# Patient Record
Sex: Female | Born: 1950 | Race: Black or African American | Hispanic: No | Smoking: Current every day smoker
Health system: Southern US, Community
[De-identification: ages and names within clinical notes are randomized; demographics above are authoritative.]

## PROBLEM LIST (undated history)

## (undated) DIAGNOSIS — R519 Headache, unspecified: Secondary | ICD-10-CM

## (undated) DIAGNOSIS — I10 Essential (primary) hypertension: Secondary | ICD-10-CM

## (undated) DIAGNOSIS — N200 Calculus of kidney: Secondary | ICD-10-CM

## (undated) DIAGNOSIS — R51 Headache: Secondary | ICD-10-CM

## (undated) HISTORY — PX: BREAST LUMPECTOMY: SHX2

## (undated) HISTORY — PX: ABDOMINAL HYSTERECTOMY: SHX81

## (undated) HISTORY — PX: APPENDECTOMY: SHX54

---

## 2014-10-25 ENCOUNTER — Other Ambulatory Visit: Payer: Self-pay

## 2014-10-25 ENCOUNTER — Encounter (HOSPITAL_BASED_OUTPATIENT_CLINIC_OR_DEPARTMENT_OTHER): Payer: Self-pay | Admitting: *Deleted

## 2014-10-25 ENCOUNTER — Emergency Department (HOSPITAL_BASED_OUTPATIENT_CLINIC_OR_DEPARTMENT_OTHER)
Admission: EM | Admit: 2014-10-25 | Discharge: 2014-10-26 | Disposition: A | Discharge status: Home / Self Care | Payer: Medicare HMO | Attending: Emergency Medicine | Admitting: Emergency Medicine

## 2014-10-25 DIAGNOSIS — M791 Myalgia: Secondary | ICD-10-CM | POA: Diagnosis not present

## 2014-10-25 DIAGNOSIS — R112 Nausea with vomiting, unspecified: Secondary | ICD-10-CM

## 2014-10-25 DIAGNOSIS — R197 Diarrhea, unspecified: Secondary | ICD-10-CM | POA: Insufficient documentation

## 2014-10-25 DIAGNOSIS — I10 Essential (primary) hypertension: Secondary | ICD-10-CM | POA: Insufficient documentation

## 2014-10-25 DIAGNOSIS — Z720 Tobacco use: Secondary | ICD-10-CM | POA: Insufficient documentation

## 2014-10-25 DIAGNOSIS — A084 Viral intestinal infection, unspecified: Secondary | ICD-10-CM | POA: Diagnosis not present

## 2014-10-25 HISTORY — DX: Essential (primary) hypertension: I10

## 2014-10-25 NOTE — ED Notes (Signed)
Pt reports N/V/D since 1430 today after eating leftover [employer] shrimp meal. Food purchased 1 day ago

## 2014-10-25 NOTE — ED Notes (Signed)
I placed patient on monitor. 

## 2014-10-26 ENCOUNTER — Inpatient Hospital Stay (HOSPITAL_COMMUNITY)
Admission: AD | Admit: 2014-10-26 | Discharge: 2014-10-26 | Disposition: A | Discharge status: Home / Self Care | Payer: Medicare HMO | Source: Ambulatory Visit | Attending: Obstetrics & Gynecology | Admitting: Obstetrics & Gynecology

## 2014-10-26 ENCOUNTER — Encounter (HOSPITAL_COMMUNITY): Payer: Self-pay | Admitting: *Deleted

## 2014-10-26 ENCOUNTER — Encounter (HOSPITAL_BASED_OUTPATIENT_CLINIC_OR_DEPARTMENT_OTHER): Payer: Self-pay | Admitting: Emergency Medicine

## 2014-10-26 DIAGNOSIS — R112 Nausea with vomiting, unspecified: Secondary | ICD-10-CM | POA: Diagnosis present

## 2014-10-26 DIAGNOSIS — R197 Diarrhea, unspecified: Secondary | ICD-10-CM | POA: Insufficient documentation

## 2014-10-26 DIAGNOSIS — A084 Viral intestinal infection, unspecified: Secondary | ICD-10-CM

## 2014-10-26 HISTORY — DX: Headache, unspecified: R51.9

## 2014-10-26 HISTORY — DX: Headache: R51

## 2014-10-26 LAB — BASIC METABOLIC PANEL
Anion gap: 11 (ref 5–15)
BUN: 19 mg/dL (ref 6–23)
CALCIUM: 9.3 mg/dL (ref 8.4–10.5)
CO2: 21 mmol/L (ref 19–32)
Chloride: 107 mmol/L (ref 96–112)
Creatinine, Ser: 0.91 mg/dL (ref 0.50–1.10)
GFR calc Af Amer: 75 mL/min — ABNORMAL LOW (ref >90)
GFR calc non Af Amer: 65 mL/min — ABNORMAL LOW (ref >90)
Glucose, Bld: 132 mg/dL — ABNORMAL HIGH (ref 70–99)
Potassium: 3.7 mmol/L (ref 3.5–5.1)
Sodium: 139 mmol/L (ref 135–145)

## 2014-10-26 LAB — URINALYSIS, ROUTINE W REFLEX MICROSCOPIC
Bilirubin Urine: NEGATIVE
Glucose, UA: NEGATIVE mg/dL
KETONES UR: 15 mg/dL — AB
LEUKOCYTES UA: NEGATIVE
NITRITE: NEGATIVE
PH: 6 (ref 5.0–8.0)
PROTEIN: NEGATIVE mg/dL
Specific Gravity, Urine: >1.03 — ABNORMAL HIGH (ref 1.005–1.030)
Urobilinogen, UA: 0.2 mg/dL (ref 0.0–1.0)

## 2014-10-26 LAB — URINE MICROSCOPIC-ADD ON

## 2014-10-26 MED ORDER — PROMETHAZINE HCL 25 MG PO TABS: 12.5000 mg | ORAL_TABLET | Freq: Four times a day (QID) | ORAL | Status: DC | PRN

## 2014-10-26 MED ORDER — ACETAMINOPHEN 500 MG PO TABS
1000.0000 mg | ORAL_TABLET | Freq: Once | ORAL | Status: AC
  Administered 2014-10-26: 1000 mg via ORAL

## 2014-10-26 MED ORDER — KETOROLAC TROMETHAMINE 30 MG/ML IJ SOLN
INTRAMUSCULAR | Status: AC
  Administered 2014-10-26: 30 mg via INTRAVENOUS
  Filled 2014-10-26: qty 1

## 2014-10-26 MED ORDER — ONDANSETRON HCL 4 MG/2ML IJ SOLN
4.0000 mg | Freq: Once | INTRAMUSCULAR | Status: AC
  Administered 2014-10-26: 4 mg via INTRAVENOUS
  Filled 2014-10-26: qty 2

## 2014-10-26 MED ORDER — ACETAMINOPHEN 500 MG PO TABS
ORAL_TABLET | ORAL | Status: AC
  Administered 2014-10-26: 1000 mg via ORAL
  Filled 2014-10-26: qty 2

## 2014-10-26 MED ORDER — SODIUM CHLORIDE 0.9 % IV BOLUS (SEPSIS)
1000.0000 mL | Freq: Once | INTRAVENOUS | Status: AC
  Administered 2014-10-26: 1000 mL via INTRAVENOUS

## 2014-10-26 MED ORDER — ONDANSETRON 8 MG PO TBDP: ORAL_TABLET | ORAL | Status: DC

## 2014-10-26 MED ORDER — KETOROLAC TROMETHAMINE 30 MG/ML IJ SOLN
30.0000 mg | Freq: Once | INTRAMUSCULAR | Status: AC
  Administered 2014-10-26: 30 mg via INTRAVENOUS

## 2014-10-26 MED ORDER — SODIUM CHLORIDE 0.9 % IV SOLN
Freq: Once | INTRAVENOUS | Status: AC
  Administered 2014-10-26: 500 mL via INTRAVENOUS

## 2014-10-26 NOTE — ED Provider Notes (Signed)
CSN: [id number]     Arrival date & time 10/25/14  2315 History  This chart was scribed for Mattias Walmsley, MD by Ronney LionSuzanne Le, ED Scribe. This patient was seen in room MH08/MH08 and the patient's care was started at 12:09 AM.    Chief Complaint  Patient presents with  . Emesis  . Diarrhea   Patient is a 64 y.o. female presenting with vomiting and diarrhea. The history is provided by the patient. No language interpreter was used.  Emesis Severity:  Moderate Duration:  10 hours Timing:  Constant Progression:  Unchanged Chronicity:  New Context: not post-tussive and not self-induced   Relieved by:  Nothing Worsened by:  Nothing tried Ineffective treatments:  None tried Associated symptoms: diarrhea and myalgias   Diarrhea Associated symptoms: myalgias and vomiting      HPI Comments: Ronnell GuadalajaraLinda Cuda is a 64 y.o. female who presents to the Emergency Department complaining of constant nausea and vomiting that began about 10 hours ago, at 2 PM today. Patient's daughter is also sick with the same symptoms, and she also has children in the household. Patient has a history of hypertension; but denies any other medical problems.   Past Medical History  Diagnosis Date  . Hypertension    Past Surgical History  Procedure Laterality Date  . Appendectomy    . Abdominal hysterectomy     History reviewed. No pertinent family history. History  Substance Use Topics  . Smoking status: Current Every Day Smoker  . Smokeless tobacco: Never Used  . Alcohol Use: Yes   OB History    No data available     Review of Systems  Gastrointestinal: Positive for nausea, vomiting and diarrhea.  Genitourinary: Negative for dysuria.  Musculoskeletal: Positive for myalgias.  All other systems reviewed and are negative.   Allergies  Review of patient's allergies indicates no known allergies.  Home Medications   Prior to Admission medications   Not on File   BP 142/99 mmHg  Pulse 116  Temp(Src) 100  F (37.8 C) (Oral)  Resp 16  Ht 5' (1.524 m)  Wt 99 lb (44.906 kg)  BMI 19.33 kg/m2  SpO2 94%  LMP 10/25/2014 Physical Exam  Constitutional: She is oriented to person, place, and time. She appears well-developed and well-nourished. No distress.  HENT:  Head: Normocephalic and atraumatic.  Mouth/Throat: Oropharynx is clear and moist. No oropharyngeal exudate.  Eyes: EOM are normal. Pupils are equal, round, and reactive to light.  Neck: Normal range of motion. Neck supple.  Cardiovascular: Normal rate, regular rhythm and normal heart sounds.   Pulmonary/Chest: Effort normal and breath sounds normal. No respiratory distress. She has no wheezes. She has no rales.  Lungs are clear to auscultation.   Abdominal: Soft. Bowel sounds are normal. She exhibits no distension and no mass. There is no tenderness. There is no rebound and no guarding.  Musculoskeletal: Normal range of motion.  Neurological: She is alert and oriented to person, place, and time. She has normal reflexes. No cranial nerve deficit.  Skin: Skin is warm and dry.  Psychiatric: She has a normal mood and affect.  Nursing note and vitals reviewed.   ED Course  Procedures (including critical care time)  DIAGNOSTIC STUDIES: Oxygen Saturation is 94% on room air, adequate by my interpretation.    COORDINATION OF CARE: 12:12 AM - Discussed treatment plan with pt at bedside which includes medication, and pt agreed to plan.   Labs Review Labs Reviewed  BASIC METABOLIC PANEL -  Abnormal; Notable for the following:    Glucose, Bld 132 (*)    GFR calc non Af Amer 65 (*)    GFR calc Af Amer 75 (*)    All other components within normal limits  URINALYSIS, ROUTINE W REFLEX MICROSCOPIC    Imaging Review No results found.   EKG Interpretation None      MDM   Final diagnoses:  None   Both patient and her daughter are here with same symptoms and time course.  Exam is benign and reassuring and patient is feeling better  post medication without further episodes in the department   I personally performed the services described in this documentation, which was scribed in my presence. The recorded information has been reviewed and is accurate.     Cy Blamer, MD 10/26/14 1314

## 2014-10-26 NOTE — Discharge Instructions (Signed)

## 2014-10-26 NOTE — MAU Provider Note (Signed)
  History    64 yo female in with nausea, vomiting and diarrhea for 4 days, daughter is also sick with the same. Was seen in [place] last night was given zofran 8 mg ODT but has not taken it yet. Dose given on eval... CSN: [id number]  Arrival date and time: 10/26/14 2143   None     No chief complaint on file.  HPI  OB History    No data available      Past Medical History  Diagnosis Date  . Hypertension     Past Surgical History  Procedure Laterality Date  . Appendectomy    . Abdominal hysterectomy      No family history on file.  History  Substance Use Topics  . Smoking status: Current Every Day Smoker  . Smokeless tobacco: Never Used  . Alcohol Use: Yes    Allergies: No Known Allergies  Prescriptions prior to admission  Medication Sig Dispense Refill Last Dose  . ondansetron (ZOFRAN ODT) 8 MG disintegrating tablet 8mg  ODT q8 hours prn nausea, not for pregnant patients 4 tablet 0     Review of Systems  Constitutional: Negative.   HENT: Negative.   Eyes: Negative.   Respiratory: Negative.   Cardiovascular: Negative.   Gastrointestinal: Positive for nausea, vomiting and diarrhea.  Genitourinary: Negative.   Musculoskeletal: Negative.   Skin: Negative.   Neurological: Negative.   Endo/Heme/Allergies: Negative.   Psychiatric/Behavioral: Negative.    Physical Exam   Last menstrual period 10/25/2014.  Physical Exam  Constitutional: She is oriented to person, place, and time. She appears well-developed and well-nourished.  HENT:  Head: Normocephalic.  Eyes: Pupils are equal, round, and reactive to light.  Neck: Normal range of motion.  Cardiovascular: Normal rate, regular rhythm, normal heart sounds and intact distal pulses.   Respiratory: Effort normal and breath sounds normal.  GI: Soft. Bowel sounds are normal.  Musculoskeletal: Normal range of motion.  Neurological: She is alert and oriented to person, place, and time. She has normal reflexes.   Skin: Skin is warm and dry.  Psychiatric: She has a normal mood and affect. Her behavior is normal. Judgment and thought content normal.    MAU Course  Procedures  MDM gasteroenteritis  Assessment and Plan  Gastroenteritis, po zofran taken by pt on admit to unit. Will introduce liqiuds and d/c home if retains.  Wyvonnia DuskyLAWSON, Gazella Anglin DARLENE 10/26/2014, 9:59 PM

## 2014-10-26 NOTE — ED Notes (Signed)
Mrs Pollie MeyerMcIntyre remains tachycardiac and c/o generalized body aches after 2 liters 0.9 NSS IV infused. Order for 500 ml 0.9 NSS obtained and infusion nearly completed. Medicated for fever and and pain.

## 2014-10-26 NOTE — MAU Note (Signed)
Pt was seen last night at [facility] for Nausea vomiting and diarrhea. She just took her first dose of zofran ODT at 2200 tonight.

## 2014-12-16 ENCOUNTER — Emergency Department (HOSPITAL_BASED_OUTPATIENT_CLINIC_OR_DEPARTMENT_OTHER)
Admission: EM | Admit: 2014-12-16 | Discharge: 2014-12-16 | Disposition: A | Discharge status: Home / Self Care | Payer: Medicare HMO | Attending: Emergency Medicine | Admitting: Emergency Medicine

## 2014-12-16 ENCOUNTER — Encounter (HOSPITAL_BASED_OUTPATIENT_CLINIC_OR_DEPARTMENT_OTHER): Payer: Self-pay | Admitting: *Deleted

## 2014-12-16 DIAGNOSIS — M7989 Other specified soft tissue disorders: Secondary | ICD-10-CM | POA: Diagnosis not present

## 2014-12-16 DIAGNOSIS — I10 Essential (primary) hypertension: Secondary | ICD-10-CM | POA: Diagnosis not present

## 2014-12-16 DIAGNOSIS — Z792 Long term (current) use of antibiotics: Secondary | ICD-10-CM | POA: Insufficient documentation

## 2014-12-16 DIAGNOSIS — Z720 Tobacco use: Secondary | ICD-10-CM | POA: Insufficient documentation

## 2014-12-16 DIAGNOSIS — Z79899 Other long term (current) drug therapy: Secondary | ICD-10-CM | POA: Diagnosis not present

## 2014-12-16 DIAGNOSIS — R1084 Generalized abdominal pain: Secondary | ICD-10-CM | POA: Insufficient documentation

## 2014-12-16 DIAGNOSIS — R42 Dizziness and giddiness: Secondary | ICD-10-CM | POA: Diagnosis not present

## 2014-12-16 DIAGNOSIS — M5432 Sciatica, left side: Secondary | ICD-10-CM

## 2014-12-16 DIAGNOSIS — M545 Low back pain: Secondary | ICD-10-CM | POA: Diagnosis present

## 2014-12-16 LAB — URINE MICROSCOPIC-ADD ON

## 2014-12-16 LAB — URINALYSIS, ROUTINE W REFLEX MICROSCOPIC
BILIRUBIN URINE: NEGATIVE
GLUCOSE, UA: NEGATIVE mg/dL
KETONES UR: NEGATIVE mg/dL
Leukocytes, UA: NEGATIVE
Nitrite: NEGATIVE
PROTEIN: NEGATIVE mg/dL
Specific Gravity, Urine: 1.027 (ref 1.005–1.030)
UROBILINOGEN UA: 0.2 mg/dL (ref 0.0–1.0)
pH: 6.5 (ref 5.0–8.0)

## 2014-12-16 MED ORDER — CYCLOBENZAPRINE HCL 5 MG PO TABS: 5.0000 mg | ORAL_TABLET | Freq: Three times a day (TID) | ORAL | Status: DC | PRN

## 2014-12-16 MED ORDER — OXYCODONE-ACETAMINOPHEN 5-325 MG PO TABS: 1.0000 | ORAL_TABLET | ORAL | Status: DC | PRN

## 2014-12-16 NOTE — ED Notes (Addendum)
Pt c/o back pain which radiates around to abd x 4 days Pty seen 2 weeks ago by PMD DX bronchitis pt states increased coughing

## 2014-12-16 NOTE — Discharge Instructions (Signed)
Return to the ED with any concerns including leg weakness, not able to urinate, loss of control of bowel or bladder, fever/chills, decreased level of alertness/lethargy, or any other alarming symptoms

## 2014-12-16 NOTE — ED Provider Notes (Signed)
CSN: [id number]     Arrival date & time 12/16/14  1742 History  This chart was scribed for Mary Scott, MD by Ronney Lion, ED Scribe. This patient was seen in room MH03/MH03 and the patient's care was started at 6:56 PM.    Chief Complaint  Patient presents with  . Back Pain   The history is provided by the patient. No language interpreter was used.    HPI Comments: Mary Bradshaw is a 64 y.o. female with a PMHx of HTN, who presents to the Emergency Department complaining of lower back pain that began 3 days ago. She also complains of associated dizziness, generalized abdominal pain, and cough. She mentions that her legs occasionally cramp and swell, which cause difficulty walking, but she is still able to ambulate. She denies any known fall or injury. She also denies having any recent recurrent problems with back pain. Patient tried Tylenol for her back pain with minimal relief. She states she also had a yeast infection last week, for which she took cefdinir that she finished about 2 days ago, as well as Tessalon for her cough. She denies any known fever, weakness, dysuria, or difficulty urinating.    Past Medical History  Diagnosis Date  . Hypertension   . Headache    Past Surgical History  Procedure Laterality Date  . Appendectomy    . Abdominal hysterectomy    . Breast lumpectomy      left side   History reviewed. No pertinent family history. History  Substance Use Topics  . Smoking status: Current Every Day Smoker -- 1.00 packs/day    Types: Cigarettes  . Smokeless tobacco: Never Used  . Alcohol Use: No   OB History    Gravida Para Term Preterm AB TAB SAB Ectopic Multiple Living   Review of Systems  Constitutional: Negative for fever.  Cardiovascular: Positive for leg swelling.  Gastrointestinal: Positive for abdominal pain (generalized).  Genitourinary: Negative for dysuria and difficulty urinating.  Musculoskeletal: Positive for back pain.   Neurological: Positive for dizziness. Negative for weakness.  All other systems reviewed and are negative.   Allergies  Review of patient's allergies indicates no known allergies.  Home Medications   Prior to Admission medications   Medication Sig Start Date End Date Taking? Authorizing Provider  albuterol (PROVENTIL) (2.5 MG/3ML) 0.083% nebulizer solution Take 2.5 mg by nebulization every 6 (six) hours as needed for wheezing or shortness of breath.   Yes Historical Provider, MD  benzonatate (TESSALON) 100 MG capsule Take by mouth 3 (three) times daily as needed for cough.   Yes Historical Provider, MD  cefdinir (OMNICEF) 300 MG capsule Take 300 mg by mouth 2 (two) times daily.   Yes Historical Provider, MD  lisinopril-hydrochlorothiazide (PRINZIDE,ZESTORETIC) 20-12.5 MG per tablet Take 1 tablet by mouth daily.   Yes Historical Provider, MD  predniSONE (DELTASONE) 2.5 MG tablet Take 2.5 mg by mouth daily with breakfast.   Yes Historical Provider, MD  cyclobenzaprine (FLEXERIL) 5 MG tablet Take 1 tablet (5 mg total) by mouth 3 (three) times daily as needed for muscle spasms. 12/16/14   Mary Scott, MD  ondansetron (ZOFRAN ODT) 8 MG disintegrating tablet  ODT q8 hours prn nausea, not for pregnant patients Patient taking differently: Take 8 mg by mouth every 8 (eight) hours as needed for nausea or vomiting.  ODT q8 hours prn nausea, not for pregnant patients 10/26/14   April  Palumbo, MD  oxyCODONE-acetaminophen (ROXICET) 5-325 MG per tablet Take 1-2 tablets by mouth every 4 (four) hours as needed for severe pain. 12/16/14   Mary Scott, MD  promethazine (PHENERGAN) 25 MG tablet Take 0.5-1 tablets (12.5-25 mg total) by mouth every 6 (six) hours as needed. 10/26/14   Armando Reichert, CNM   BP 97/70 mmHg  Pulse 78  Temp(Src) 99.1 F (37.3 C)  Resp 18  Ht 5' (1.524 m)  Wt 100 lb (45.36 kg)  BMI 19.53 kg/m2  SpO2 100%  LMP 10/25/1995  Vitals reviewed Physical Exam  Physical  Examination: General appearance - alert, well appearing, and in no distress Mental status - alert, oriented to person, place, and time Eyes -no conjunctival injection, no scleral icterus Mouth - mucous membranes moist, pharynx normal without lesions Chest - clear to auscultation, no wheezes, rales or rhonchi, symmetric air entry Heart - normal rate, regular rhythm, normal S1, S2, no murmurs, rubs, clicks or gallops Abdomen - soft, nontender, nondistended, no masses or organomegaly, nabs Back- left paraspinal tenderness, no CVA tenderness, no midline tenderness Extremities - peripheral pulses normal, no pedal edema, no clubbing or cyanosis Skin - normal coloration and turgor, no rashes  ED Course  Procedures (including critical care time)  DIAGNOSTIC STUDIES: Oxygen Saturation is 100% on RA, normal by my interpretation.    COORDINATION OF CARE: 7:00 PM - Discussed treatment plan with pt at bedside which includes short-term pain medication, muscle relaxants, and f/u with PCP, and pt agreed to plan. Will also do a UA.   Labs Review Results for orders placed or performed during the hospital encounter of 12/16/14  Urinalysis, Routine w reflex microscopic (not at [facility])  Result Value Ref Range   Color, Urine YELLOW YELLOW   APPearance CLEAR CLEAR   Specific Gravity, Urine 1.027 1.005 - 1.030   pH 6.5 5.0 - 8.0   Glucose, UA NEGATIVE NEGATIVE mg/dL   Hgb urine dipstick SMALL (A) NEGATIVE   Bilirubin Urine NEGATIVE NEGATIVE   Ketones, ur NEGATIVE NEGATIVE mg/dL   Protein, ur NEGATIVE NEGATIVE mg/dL   Urobilinogen, UA 0.2 0.0 - 1.0 mg/dL   Nitrite NEGATIVE NEGATIVE   Leukocytes, UA NEGATIVE NEGATIVE  Urine microscopic-add on  Result Value Ref Range   Squamous Epithelial / LPF RARE RARE   WBC, UA 0-2 <3 WBC/hpf   RBC / HPF 3-6 <3 RBC/hpf   Bacteria, UA RARE RARE   Urine-Other MUCOUS PRESENT     MDM   Final diagnoses:  Sciatica without back pain, left   Pt presenting with c/o  left lower back pain, no midline tenderness. No signs or symptoms of cauda equina.  No fever to suggest epidural abscess.  No abdominal tenderness.  Pt also c/o cough and cold symptoms.  She finished course of cefdinir 2 days ago.  Normal respiratory effort, lungs are CTA.  No fever.  Discharged with strict return precautions.  Pt agreeable with plan.  I personally performed the services described in this documentation, which was scribed in my presence. The recorded information has been reviewed and is accurate.     Mary Scott, MD 12/16/14 2051

## 2016-10-25 ENCOUNTER — Emergency Department (HOSPITAL_BASED_OUTPATIENT_CLINIC_OR_DEPARTMENT_OTHER)
Admission: EM | Admit: 2016-10-25 | Discharge: 2016-10-25 | Disposition: A | Discharge status: Home / Self Care | Payer: Medicare HMO | Attending: Emergency Medicine | Admitting: Emergency Medicine

## 2016-10-25 ENCOUNTER — Encounter (HOSPITAL_BASED_OUTPATIENT_CLINIC_OR_DEPARTMENT_OTHER): Payer: Self-pay | Admitting: Emergency Medicine

## 2016-10-25 ENCOUNTER — Emergency Department (HOSPITAL_BASED_OUTPATIENT_CLINIC_OR_DEPARTMENT_OTHER): Payer: Medicare HMO

## 2016-10-25 DIAGNOSIS — Z79899 Other long term (current) drug therapy: Secondary | ICD-10-CM | POA: Insufficient documentation

## 2016-10-25 DIAGNOSIS — F17210 Nicotine dependence, cigarettes, uncomplicated: Secondary | ICD-10-CM | POA: Diagnosis not present

## 2016-10-25 DIAGNOSIS — R059 Cough, unspecified: Secondary | ICD-10-CM

## 2016-10-25 DIAGNOSIS — R05 Cough: Secondary | ICD-10-CM | POA: Diagnosis not present

## 2016-10-25 DIAGNOSIS — I10 Essential (primary) hypertension: Secondary | ICD-10-CM | POA: Diagnosis not present

## 2016-10-25 DIAGNOSIS — R072 Precordial pain: Secondary | ICD-10-CM | POA: Insufficient documentation

## 2016-10-25 DIAGNOSIS — R0981 Nasal congestion: Secondary | ICD-10-CM | POA: Insufficient documentation

## 2016-10-25 LAB — BASIC METABOLIC PANEL
ANION GAP: 9 (ref 5–15)
BUN: 14 mg/dL (ref 6–20)
CALCIUM: 9.3 mg/dL (ref 8.9–10.3)
CO2: 25 mmol/L (ref 22–32)
Chloride: 104 mmol/L (ref 101–111)
Creatinine, Ser: 0.83 mg/dL (ref 0.44–1.00)
GFR calc Af Amer: >60 mL/min (ref >60)
Glucose, Bld: 93 mg/dL (ref 65–99)
POTASSIUM: 4 mmol/L (ref 3.5–5.1)
Sodium: 138 mmol/L (ref 135–145)

## 2016-10-25 LAB — TROPONIN I: Troponin I: <0.03 ng/mL (ref <0.03)

## 2016-10-25 LAB — CBC
HCT: 45.5 % (ref 36.0–46.0)
Hemoglobin: 15.3 g/dL — ABNORMAL HIGH (ref 12.0–15.0)
MCH: 32.3 pg (ref 26.0–34.0)
MCHC: 33.6 g/dL (ref 30.0–36.0)
MCV: 96 fL (ref 78.0–100.0)
Platelets: 257 10*3/uL (ref 150–400)
RBC: 4.74 MIL/uL (ref 3.87–5.11)
RDW: 13.8 % (ref 11.5–15.5)
WBC: 4.5 10*3/uL (ref 4.0–10.5)

## 2016-10-25 MED ORDER — ASPIRIN 81 MG PO CHEW
324.0000 mg | CHEWABLE_TABLET | Freq: Once | ORAL | Status: AC
  Administered 2016-10-25: 324 mg via ORAL
  Filled 2016-10-25: qty 4

## 2016-10-25 NOTE — ED Provider Notes (Signed)
Emergency Department Provider Note   I have reviewed the triage vital signs and the nursing notes.   HISTORY  Chief Complaint Chest Pain   HPI Mary Bradshaw is a 66 y.o. female with past medical history of headache and hypertension presents to the emergency department for evaluation of intermittent chest pain, cough, nasal congestion for the last several weeks with nausea this AM. Patient states she is intermittently had left sided, nonradiating, chest pain for the past several weeks. Today she has pain most of the day. This morning she had pain for approximately 15 minutes that resolved without any treatment. No exacerbating or alleviating factors. Not worse with coughing. She denies any fevers or chills. No prior history of ACS. Not actively having pain.    Past Medical History:  Diagnosis Date  . Headache   . Hypertension     There are no active problems to display for this patient.   Past Surgical History:  Procedure Laterality Date  . ABDOMINAL HYSTERECTOMY    . APPENDECTOMY    . BREAST LUMPECTOMY     left side    Current Outpatient Rx  . Order #: [id number] Class: Historical Med  . Order #: [id number] Class: Historical Med  . Order #: [id number] Class: Historical Med  . Order #: [id number] Class: Print  . Order #: [id number] Class: Historical Med  . Order #: [id number] Class: Print  . Order #: [id number] Class: Print  . Order #: [id number] Class: Historical Med  . Order #: [id number] Class: Normal    Allergies Patient has no known allergies.  History reviewed. No pertinent family history.  Social History Social History  Substance Use Topics  . Smoking status: Current Every Day Smoker    Packs/day: 1.00    Types: Cigarettes  . Smokeless tobacco: Never Used  . Alcohol use No    Review of Systems  Constitutional: No fever/chills Eyes: No visual changes. ENT: No sore throat. Cardiovascular: Positive intermittent chest pain. Respiratory: Denies shortness of  breath. Positive cough and runny nose.  Gastrointestinal: No abdominal pain.  No nausea, no vomiting.  No diarrhea.  No constipation. Genitourinary: Negative for dysuria. Musculoskeletal: Negative for back pain. Skin: Negative for rash. Neurological: Negative for headaches, focal weakness or numbness.  10-point ROS otherwise negative.  ____________________________________________   PHYSICAL EXAM:  VITAL SIGNS: ED Triage Vitals  Enc Vitals Group     BP 10/25/16 1023 (!) 152/101     Pulse Rate 10/25/16 1023 82     Resp 10/25/16 1023 (!) 24     Temp 10/25/16 1023 98.4 F (36.9 C)     Temp Source 10/25/16 1023 Oral     SpO2 10/25/16 1023 100 %     Weight 10/25/16 1024 100 lb (45.4 kg)     Height 10/25/16 1024 5' (1.524 m)     Pain Score 10/25/16 1023 6   Constitutional: Alert and oriented. Well appearing and in no acute distress. Eyes: Conjunctivae are normal.  Head: Atraumatic. Nose: No congestion/rhinnorhea. Mouth/Throat: Mucous membranes are moist.  Oropharynx non-erythematous. Neck: No stridor.   Cardiovascular: Normal rate, regular rhythm. Good peripheral circulation. Grossly normal heart sounds.   Respiratory: Normal respiratory effort.  No retractions. Lungs CTAB. Gastrointestinal: Soft and nontender. No distention.  Musculoskeletal: No lower extremity tenderness nor edema. No gross deformities of extremities. No chest wall tenderness.  Neurologic:  Normal speech and language. No gross focal neurologic deficits are appreciated.  Skin:  Skin is warm, dry and intact. No rash noted.   ____________________________________________   Vickie Epley (  all labs ordered are listed, but only abnormal results are displayed)  Labs Reviewed  CBC - Abnormal; Notable for the following:       Result Value   Hemoglobin 15.3 (*)    All other components within normal limits  BASIC METABOLIC PANEL  TROPONIN I  TROPONIN I   ____________________________________________  EKG   EKG  Interpretation  Date/Time:  Wednesday October 25 2016 10:23:46 EDT Ventricular Rate:  84 PR Interval:    QRS Duration: 86 QT Interval:  369 QTC Calculation: 437 R Axis:   -53 Text Interpretation:  Sinus rhythm Left anterior fascicular block Abnormal R-wave progression, early transition Left ventricular hypertrophy Baseline wander in lead(s) V1 V6 No STEMI.  Confirmed by LONG MD, JOSHUA [id number]) on 10/25/2016 11:05:17 AM       ____________________________________________  RADIOLOGY  Dg Chest 2 View  Result Date: 10/25/2016 CLINICAL DATA:  66 year old female with a history of cough and congestion for 2 weeks EXAM: CHEST  2 VIEW COMPARISON:  None. FINDINGS: Cardiomediastinal silhouette within normal limits. No evidence of central vascular congestion. Calcifications of the aortic arch. Coarsened interstitial markings with pleuroparenchymal scarring at the apices of the lungs. No confluent airspace disease.  No pneumothorax or pleural effusion. No displaced fracture.  Apex right curvature of the thoracic spine. Calcifications of abdominal aorta. IMPRESSION: No evidence of acute cardiopulmonary disease, with likely chronic changes. Aortic atherosclerosis. Electronically Signed   By: Gilmer Mor D.O.   On: 10/25/2016 10:47    ____________________________________________   PROCEDURES  Procedure(s) performed:   Procedures  None ____________________________________________   INITIAL IMPRESSION / ASSESSMENT AND PLAN / ED COURSE  Pertinent labs & imaging results that were available during my care of the patient were reviewed by me and considered in my medical decision making (see chart for details).  Patient resents to the emergency department for evaluation of intermittent chest pain for the last several weeks with associated cough, congestion, nausea that started this morning. No active vomiting. The patient not currently having chest pain. No focal tenderness to palpation of the chest  wall. No fevers or chills. Patient is well-appearing. She does have several risk factors for coronary artery disease but feels less likely in the setting of prolonged pain symptoms that are atypical. EKG is unremarkable. Plan for lab work and reassessment.  1:50 PM Second troponin is negative. No active chest pain. Suspect URI symptoms as cause but will refer to PCP and Cardiology for further risk stratification.   At this time, I do not feel there is any life-threatening condition present. I have reviewed and discussed all results (EKG, imaging, lab, urine as appropriate), exam findings with patient. I have reviewed nursing notes and appropriate previous records.  I feel the patient is safe to be discharged home without further emergent workup. Discussed usual and customary return precautions. Patient and family (if present) verbalize understanding and are comfortable with this plan.  Patient will follow-up with their primary care provider. If they do not have a primary care provider, information for follow-up has been provided to them. All questions have been answered.  ____________________________________________  FINAL CLINICAL IMPRESSION(S) / ED DIAGNOSES  Final diagnoses:  Precordial chest pain  Cough     MEDICATIONS GIVEN DURING THIS VISIT:  Medications  aspirin chewable tablet 324 mg (324 mg Oral Given 10/25/16 1140)     NEW OUTPATIENT MEDICATIONS STARTED DURING THIS VISIT:  None   Note:  This document was prepared using Conservation officer, historic buildings and  may include unintentional dictation errors.  Alona Bene, MD Emergency Medicine   Maia Plan, MD 10/25/16 [phone]

## 2016-10-25 NOTE — ED Triage Notes (Signed)
Patient reports that she has had dizziness x 3 weeks with nasal drainage. Patient reports that about 2 -3 days ago she started to have a cough and central chest pain. The patient reports that she can not get anything to "calm the pain" - has tried several home remedies

## 2016-10-25 NOTE — Discharge Instructions (Signed)

## 2017-07-13 ENCOUNTER — Emergency Department (HOSPITAL_BASED_OUTPATIENT_CLINIC_OR_DEPARTMENT_OTHER): Payer: Medicare HMO

## 2017-07-13 ENCOUNTER — Encounter (HOSPITAL_BASED_OUTPATIENT_CLINIC_OR_DEPARTMENT_OTHER): Payer: Self-pay | Admitting: Emergency Medicine

## 2017-07-13 ENCOUNTER — Emergency Department (HOSPITAL_BASED_OUTPATIENT_CLINIC_OR_DEPARTMENT_OTHER)
Admission: EM | Admit: 2017-07-13 | Discharge: 2017-07-13 | Disposition: A | Discharge status: Home / Self Care | Payer: Medicare HMO | Attending: Emergency Medicine | Admitting: Emergency Medicine

## 2017-07-13 ENCOUNTER — Other Ambulatory Visit: Payer: Self-pay

## 2017-07-13 DIAGNOSIS — K5900 Constipation, unspecified: Secondary | ICD-10-CM | POA: Diagnosis not present

## 2017-07-13 DIAGNOSIS — R05 Cough: Secondary | ICD-10-CM | POA: Diagnosis not present

## 2017-07-13 DIAGNOSIS — Z79899 Other long term (current) drug therapy: Secondary | ICD-10-CM | POA: Insufficient documentation

## 2017-07-13 DIAGNOSIS — R059 Cough, unspecified: Secondary | ICD-10-CM

## 2017-07-13 DIAGNOSIS — F17210 Nicotine dependence, cigarettes, uncomplicated: Secondary | ICD-10-CM | POA: Diagnosis not present

## 2017-07-13 DIAGNOSIS — R109 Unspecified abdominal pain: Secondary | ICD-10-CM

## 2017-07-13 DIAGNOSIS — I10 Essential (primary) hypertension: Secondary | ICD-10-CM | POA: Insufficient documentation

## 2017-07-13 MED ORDER — ALBUTEROL SULFATE HFA 108 (90 BASE) MCG/ACT IN AERS: 1.0000 | INHALATION_SPRAY | Freq: Four times a day (QID) | RESPIRATORY_TRACT | 0 refills | Status: AC | PRN

## 2017-07-13 MED ORDER — DOCUSATE SODIUM 100 MG PO CAPS: 100.0000 mg | ORAL_CAPSULE | Freq: Two times a day (BID) | ORAL | 0 refills | Status: DC

## 2017-07-13 MED ORDER — BENZONATATE 100 MG PO CAPS: 100.0000 mg | ORAL_CAPSULE | Freq: Three times a day (TID) | ORAL | 0 refills | Status: DC

## 2017-07-13 MED FILL — DOK 100 MG SOFTGEL: 100 | 50 days supply | Qty: 100 | Fill #0

## 2017-07-13 MED FILL — VENTOLIN HFA 90 MCG INHALER: 108 (90 BAS | 25 days supply | Qty: 18 | Fill #0

## 2017-07-13 MED FILL — BENZONATATE 100 MG CAPSULE: 100 | 7 days supply | Qty: 21 | Fill #0

## 2017-07-13 NOTE — Discharge Instructions (Signed)
Placed in constipation. Albuterol inhaler as needed. Take Tessalon for your cough. Primary care follow-up.

## 2017-07-13 NOTE — ED Triage Notes (Addendum)
Patient states that she has had a cough x 2 weeks. Denies any Fever  - patient states that she is bloated as well - and maybe has constipation

## 2017-07-13 NOTE — ED Provider Notes (Signed)
[facility] EMERGENCY DEPARTMENT Provider Note   CSN: [id number] Arrival date & time: 07/13/17  0915     History   Chief Complaint Chief Complaint  Patient presents with  . Cough    HPI Mary Bradshaw is a 67 y.o. female. Chief complaint is cough, constipation  HPI 67 year old female. Is had "a cold" for 3 weeks with occasional cough and runny nose. Some sinus congestion and no sinus pain. No fever. Temp 98 this morning. Feels congested and full in her chest. States she normally has only bowel movements once or twice per week but has noticed that she felt bloated and uncomfortable. She uses suppository yesterday and had bowel movement. Still feels "very full". No vomiting. No pain. No diarrhea.  Past Medical History:  Diagnosis Date  . Headache   . Hypertension     There are no active problems to display for this patient.   Past Surgical History:  Procedure Laterality Date  . ABDOMINAL HYSTERECTOMY    . APPENDECTOMY    . BREAST LUMPECTOMY     left side    OB History    Gravida Para Term Preterm AB Living   2 2 2     2    SAB TAB Ectopic Multiple Live Births                   Home Medications    Prior to Admission medications   Medication Sig Start Date End Date Taking? Authorizing Provider  albuterol (PROVENTIL HFA;VENTOLIN HFA) 108 (90 Base) MCG/ACT inhaler Inhale 1-2 puffs into the lungs every 6 (six) hours as needed for wheezing. 07/13/17   Rolland Porter, MD  benzonatate (TESSALON) 100 MG capsule Take 1 capsule (100 mg total) by mouth every 8 (eight) hours. 07/13/17   Rolland Porter, MD  cefdinir (OMNICEF) 300 MG capsule Take 300 mg by mouth 2 (two) times daily.    [provider]  cyclobenzaprine (FLEXERIL) 5 MG tablet Take 1 tablet (5 mg total) by mouth 3 (three) times daily as needed for muscle spasms. 12/16/14   Mabe, Latanya Maudlin, MD  docusate sodium (COLACE) 100 MG capsule Take 1 capsule (100 mg total) by mouth every 12 (twelve) hours. 07/13/17    Rolland Porter, MD  lisinopril-hydrochlorothiazide (PRINZIDE,ZESTORETIC) 20-12.5 MG per tablet Take 1 tablet by mouth daily.    [provider]  ondansetron (ZOFRAN ODT) 8 MG disintegrating tablet 8mg  ODT q8 hours prn nausea, not for pregnant patients Patient taking differently: Take 8 mg by mouth every 8 (eight) hours as needed for nausea or vomiting. 8mg  ODT q8 hours prn nausea, not for pregnant patients 10/26/14   Palumbo, April, MD  oxyCODONE-acetaminophen (ROXICET) 5-325 MG per tablet Take 1-2 tablets by mouth every 4 (four) hours as needed for severe pain. 12/16/14   Mabe, Latanya Maudlin, MD  predniSONE (DELTASONE) 2.5 MG tablet Take 2.5 mg by mouth daily with breakfast.    [provider]  promethazine (PHENERGAN) 25 MG tablet Take 0.5-1 tablets (12.5-25 mg total) by mouth every 6 (six) hours as needed. 10/26/14   Armando Reichert, CNM    Family History History reviewed. No pertinent family history.  Social History Social History   Tobacco Use  . Smoking status: Current Every Day Smoker    Packs/day: 1.00    Types: Cigarettes  . Smokeless tobacco: Never Used  Substance Use Topics  . Alcohol use: No  . Drug use: No     Allergies   Patient has  no known allergies.   Review of Systems Review of Systems  Constitutional: Negative for appetite change, chills, diaphoresis, fatigue and fever.  HENT: Positive for congestion. Negative for mouth sores, sore throat and trouble swallowing.   Eyes: Negative for visual disturbance.  Respiratory: Positive for cough. Negative for chest tightness, shortness of breath and wheezing.   Cardiovascular: Negative for chest pain.  Gastrointestinal: Positive for abdominal distention and constipation. Negative for abdominal pain, diarrhea, nausea and vomiting.  Endocrine: Negative for polydipsia, polyphagia and polyuria.  Genitourinary: Negative for dysuria, frequency and hematuria.  Musculoskeletal: Negative for gait problem.  Skin:  Negative for color change, pallor and rash.  Neurological: Negative for dizziness, syncope, light-headedness and headaches.  Hematological: Does not bruise/bleed easily.  Psychiatric/Behavioral: Negative for behavioral problems and confusion.     Physical Exam Updated Vital Signs BP (!) 186/94 (BP Location: Left Arm)   Pulse 73   Temp 98.9 F (37.2 C) (Oral)   Resp (!) 22   Ht 5' (1.524 m)   Wt 45.4 kg (100 lb)   LMP 10/25/2014 Comment: hysterectomy  SpO2 100%   BMI 19.53 kg/m   Physical Exam  Constitutional: She is oriented to person, place, and time. She appears well-developed and well-nourished. No distress.  HENT:  Head: Normocephalic.  Eyes: Conjunctivae are normal. Pupils are equal, round, and reactive to light. No scleral icterus.  Neck: Normal range of motion. Neck supple. No thyromegaly present.  Cardiovascular: Normal rate and regular rhythm. Exam reveals no gallop and no friction rub.  No murmur heard. Pulmonary/Chest: Effort normal and breath sounds normal. No respiratory distress. She has no wheezes. She has no rales.  Cough. No wheezing. No adventitial breath sounds or asymmetry.  Abdominal: Soft. Bowel sounds are normal. She exhibits no distension. There is no tenderness. There is no rebound.  Normal abdominal exam. No peritoneal irritation. Normal active bowel sounds.  Musculoskeletal: Normal range of motion.  Neurological: She is alert and oriented to person, place, and time.  Skin: Skin is warm and dry. No rash noted.  Psychiatric: She has a normal mood and affect. Her behavior is normal.     ED Treatments / Results  Labs (all labs ordered are listed, but only abnormal results are displayed) Labs Reviewed - No data to display  EKG  EKG Interpretation None       Radiology Dg Chest 2 View  Result Date: 07/13/2017 CLINICAL DATA:  Cough. EXAM: CHEST  2 VIEW COMPARISON:  CT 11/05/2016.  Chest x-ray 11/05/2016. FINDINGS: Mediastinum and hilar  structures normal. Cardiomegaly with normal pulmonary vascularity. No focal infiltrate. Interval clearing of previously identified right mid lung infiltrate. No pleural effusion or pneumothorax. Biapical pleural thickening noted consistent scarring again noted. Thoracolumbar spine scoliosis. IMPRESSION: Interval clearing of previously identified right mid lung field infiltrate. Electronically Signed   By: Maisie Fushomas  Register   On: 07/13/2017 10:08   Dg Abd 2 Views  Result Date: 07/13/2017 CLINICAL DATA:  Cough for 2 weeks. Bloating. Possible constipation. EXAM: ABDOMEN - 2 VIEW COMPARISON:  CT scan March 30, 2014 FINDINGS: The lung bases are normal. No free air, portal venous gas, or pneumatosis. The abdominal aorta appears to be tortuous and atherosclerotic. No renal stones identified. Mild fecal loading in the colon. No bowel obstruction. Calcifications in the pelvis are likely phleboliths. No other acute abnormalities. IMPRESSION: 1. The lung bases are normal. 2. No cause for bloating identified. Electronically Signed   By: Gerome Samavid  Williams III M.D  On: 07/13/2017 10:09    Procedures Procedures (including critical care time)  Medications Ordered in ED Medications - No data to display   Initial Impression / Assessment and Plan / ED Course  I have reviewed the triage vital signs and the nursing notes.  Pertinent labs & imaging results that were available during my care of the patient were reviewed by me and considered in my medical decision making (see chart for details).     Negative films. Not constipated. No infiltrates. Plan albuterol inhaler and Tessalon. Colace. Primary care follow-up.  Final Clinical Impressions(s) / ED Diagnoses   Final diagnoses:  Cough  Constipation, unspecified constipation type    ED Discharge Orders        Ordered    albuterol (PROVENTIL HFA;VENTOLIN HFA) 108 (90 Base) MCG/ACT inhaler  Every 6 hours PRN     07/13/17 1034    benzonatate (TESSALON) 100  MG capsule  Every 8 hours     07/13/17 1034    docusate sodium (COLACE) 100 MG capsule  Every 12 hours     07/13/17 1034       Rolland Porter, MD 07/13/17 1035

## 2017-07-13 NOTE — ED Notes (Signed)
Patient transported to X-ray 

## 2017-11-28 ENCOUNTER — Encounter (HOSPITAL_BASED_OUTPATIENT_CLINIC_OR_DEPARTMENT_OTHER): Payer: Self-pay

## 2017-11-28 ENCOUNTER — Emergency Department (HOSPITAL_BASED_OUTPATIENT_CLINIC_OR_DEPARTMENT_OTHER)
Admission: EM | Admit: 2017-11-28 | Discharge: 2017-11-28 | Disposition: A | Discharge status: Home / Self Care | Payer: Medicare HMO | Attending: Emergency Medicine | Admitting: Emergency Medicine

## 2017-11-28 ENCOUNTER — Other Ambulatory Visit: Payer: Self-pay

## 2017-11-28 DIAGNOSIS — R300 Dysuria: Secondary | ICD-10-CM | POA: Diagnosis not present

## 2017-11-28 DIAGNOSIS — R309 Painful micturition, unspecified: Secondary | ICD-10-CM | POA: Diagnosis not present

## 2017-11-28 DIAGNOSIS — F17210 Nicotine dependence, cigarettes, uncomplicated: Secondary | ICD-10-CM | POA: Insufficient documentation

## 2017-11-28 DIAGNOSIS — R310 Gross hematuria: Secondary | ICD-10-CM

## 2017-11-28 DIAGNOSIS — I10 Essential (primary) hypertension: Secondary | ICD-10-CM | POA: Diagnosis not present

## 2017-11-28 DIAGNOSIS — Z79899 Other long term (current) drug therapy: Secondary | ICD-10-CM | POA: Diagnosis not present

## 2017-11-28 DIAGNOSIS — R319 Hematuria, unspecified: Secondary | ICD-10-CM | POA: Diagnosis present

## 2017-11-28 LAB — URINALYSIS, MICROSCOPIC (REFLEX): WBC, UA: NONE SEEN WBC/hpf (ref 0–5)

## 2017-11-28 LAB — URINALYSIS, ROUTINE W REFLEX MICROSCOPIC
Bilirubin Urine: NEGATIVE
Glucose, UA: NEGATIVE mg/dL
Ketones, ur: NEGATIVE mg/dL
Leukocytes, UA: NEGATIVE
Nitrite: NEGATIVE
Protein, ur: NEGATIVE mg/dL
Specific Gravity, Urine: 1.025 (ref 1.005–1.030)
pH: 6 (ref 5.0–8.0)

## 2017-11-28 MED ORDER — HYDROCODONE-ACETAMINOPHEN 5-325 MG PO TABS: 1.0000 | ORAL_TABLET | ORAL | 0 refills | Status: DC | PRN

## 2017-11-28 NOTE — ED Notes (Signed)
Pt verbalizes understanding of d/c instructions and denies any further needs at this time. 

## 2017-11-28 NOTE — ED Provider Notes (Signed)
[facility] EMERGENCY DEPARTMENT Provider Note   CSN: [id number] Arrival date & time: 11/28/17  2014     History   Chief Complaint Chief Complaint  Patient presents with  . Hematuria    HPI Mary Bradshaw is a 67 y.o. female with past medical history of hypertension presenting with hematuria and dysuria.  Patient explains that she experienced a deep right-sided flank pain 2 days ago which resolved and then started to experience pain on urination and noticed blood in the urine when wiping and noting a "pink color". Denies any fever, chills, back pain, nausea, vomiting, abdominal pain, diarrhea, melena or constipation.  She reports normal bowel movements.  She has been able to void fully.  Patient denies any pain unless she is urinating.  HPI  Past Medical History:  Diagnosis Date  . Headache   . Hypertension     There are no active problems to display for this patient.   Past Surgical History:  Procedure Laterality Date  . ABDOMINAL HYSTERECTOMY    . APPENDECTOMY    . BREAST LUMPECTOMY     left side     OB History    Gravida  2   Para  2   Term  2   Preterm      AB      Living  2     SAB      TAB      Ectopic      Multiple      Live Births               Home Medications    Prior to Admission medications   Medication Sig Start Date End Date Taking? Authorizing Provider  albuterol (PROVENTIL HFA;VENTOLIN HFA) 108 (90 Base) MCG/ACT inhaler Inhale 1-2 puffs into the lungs every 6 (six) hours as needed for wheezing. 07/13/17   Rolland Porter, MD  benzonatate (TESSALON) 100 MG capsule Take 1 capsule (100 mg total) by mouth every 8 (eight) hours. 07/13/17   Rolland Porter, MD  cefdinir (OMNICEF) 300 MG capsule Take 300 mg by mouth 2 (two) times daily.    [provider]  cyclobenzaprine (FLEXERIL) 5 MG tablet Take 1 tablet (5 mg total) by mouth 3 (three) times daily as needed for muscle spasms. 12/16/14   Mabe, Latanya Maudlin, MD  docusate  sodium (COLACE) 100 MG capsule Take 1 capsule (100 mg total) by mouth every 12 (twelve) hours. 07/13/17   Rolland Porter, MD  HYDROcodone-acetaminophen (NORCO/VICODIN) 5-325 MG tablet Take 1 tablet by mouth every 4 (four) hours as needed. 11/28/17   Georgiana Shore, PA-C  lisinopril-hydrochlorothiazide (PRINZIDE,ZESTORETIC) 20-12.5 MG per tablet Take 1 tablet by mouth daily.    [provider]  ondansetron (ZOFRAN ODT) 8 MG disintegrating tablet  ODT q8 hours prn nausea, not for pregnant patients Patient taking differently: Take 8 mg by mouth every 8 (eight) hours as needed for nausea or vomiting.  ODT q8 hours prn nausea, not for pregnant patients 10/26/14   Palumbo, April, MD  oxyCODONE-acetaminophen (ROXICET) 5-325 MG per tablet Take 1-2 tablets by mouth every 4 (four) hours as needed for severe pain. 12/16/14   Mabe, Latanya Maudlin, MD  predniSONE (DELTASONE) 2.5 MG tablet Take 2.5 mg by mouth daily with breakfast.    [provider]  promethazine (PHENERGAN) 25 MG tablet Take 0.5-1 tablets (12.5-25 mg total) by mouth every 6 (six) hours as needed. 10/26/14   Armando Reichert, CNM    Family  History No family history on file.  Social History Social History   Tobacco Use  . Smoking status: Current Every Day Smoker    Packs/day: 1.00    Types: Cigarettes  . Smokeless tobacco: Never Used  Substance Use Topics  . Alcohol use: No  . Drug use: No     Allergies   Patient has no known allergies.   Review of Systems Review of Systems  Constitutional: Negative for chills, diaphoresis, fatigue and fever.  Cardiovascular: Negative for palpitations.  Gastrointestinal: Negative for abdominal distention, abdominal pain, anal bleeding, blood in stool, constipation, diarrhea, nausea and vomiting.  Genitourinary: Positive for dysuria, flank pain, frequency and hematuria. Negative for decreased urine volume, difficulty urinating, pelvic pain and vaginal bleeding.  Musculoskeletal:  Negative for back pain, myalgias, neck pain and neck stiffness.  Skin: Negative for color change and pallor.  Neurological: Negative for dizziness and headaches.     Physical Exam Updated Vital Signs BP (!) 148/93 (BP Location: Left Arm)   Pulse 79   Temp 98.2 F (36.8 C) (Oral)   Resp 20   Ht  (1.499 m)   Wt 46 kg (101 lb 6.6 oz)   LMP 10/25/2014 Comment: hysterectomy  SpO2 99%   BMI 20.48 kg/m   Physical Exam  Constitutional: She appears well-developed and well-nourished. No distress.  Well-appearing, nontoxic afebrile sitting comfortably in bed no acute distress.  HENT:  Head: Normocephalic and atraumatic.  Eyes: Conjunctivae and EOM are normal.  Neck: Normal range of motion.  Cardiovascular: Normal rate, regular rhythm and normal heart sounds.  Pulmonary/Chest: Effort normal and breath sounds normal. No stridor. No respiratory distress. She has no wheezes. She has no rales.  Abdominal: Soft. She exhibits no distension and no mass. There is no tenderness. There is no rebound and no guarding.  Abdomen is soft and nontender to palpation.  No CVA tenderness.  Musculoskeletal: Normal range of motion. She exhibits no edema.  Neurological: She is alert.  Skin: Skin is warm and dry. No rash noted. She is not diaphoretic. No erythema. No pallor.  Psychiatric: She has a normal mood and affect.  Nursing note and vitals reviewed.    ED Treatments / Results  Labs (all labs ordered are listed, but only abnormal results are displayed) Labs Reviewed  URINALYSIS, ROUTINE W REFLEX MICROSCOPIC - Abnormal; Notable for the following components:      Result Value   Hgb urine dipstick MODERATE (*)    All other components within normal limits  URINALYSIS, MICROSCOPIC (REFLEX) - Abnormal; Notable for the following components:   Bacteria, UA RARE (*)    All other components within normal limits  URINE CULTURE    EKG None  Radiology No results found.  Procedures Procedures  (including critical care time)  Medications Ordered in ED Medications - No data to display   Initial Impression / Assessment and Plan / ED Course  I have reviewed the triage vital signs and the nursing notes.  Pertinent labs & imaging results that were available during my care of the patient were reviewed by me and considered in my medical decision making (see chart for details).    Patient presenting with right-sided flank pain which resolved from 2 days ago and was followed today by hematuria and pain on urination.  UA without evidence of infection but does show some red blood cells. Reviewed patient's chart and care everywhere noted that she has had a prior CT abdomen that showed nephrolithiasis on the  left.  Abdomen is soft and nontender to palpation she is afebrile nontoxic and without CVA tenderness.  Has been able to void and I have no concerns for urinary retention at this time.  Past history of known stones and no other symptoms, fact that patient may have just passed stone explaining her symptoms.  Discharge home with symptomatic relief and close follow-up with urology as well as PCP.  explained that even if she is asymptomatic she will need to have a repeat UA to ensure resolution of hematuria.  Discussed return precautions and patient understands and agrees with plan.  Final Clinical Impressions(s) / ED Diagnoses   Final diagnoses:  Gross hematuria  Dysuria    ED Discharge Orders        Ordered    HYDROcodone-acetaminophen (NORCO/VICODIN) 5-325 MG tablet  Every 4 hours PRN     11/28/17 2230       Gregary Cromer 11/28/17 2308    Maia Plan, MD 11/29/17 1327

## 2017-11-28 NOTE — Discharge Instructions (Signed)
As discussed, take your pain medications as needed and use the strainer when you urinate to attempt to catch any further stones passing.  Stay well-hydrated and follow diet provided to help prevent kidney stones in the future.  Follow-up with urology.  Follow-up with your primary care provider.  You will need to have a repeat urine analysis to ensure that the blood in your urine has resolved.  Return sooner if symptoms worsen, you experience pain in your abdomen, nausea, vomiting, difficulty urinating or any other new concerning symptoms in the meantime.

## 2017-11-28 NOTE — ED Triage Notes (Signed)
C/o hematuria, freq x today-right flank pain x 3 days-NAD-steady gait

## 2017-11-30 LAB — URINE CULTURE: Culture: NO GROWTH

## 2017-12-16 ENCOUNTER — Emergency Department (HOSPITAL_BASED_OUTPATIENT_CLINIC_OR_DEPARTMENT_OTHER)
Admission: EM | Admit: 2017-12-16 | Discharge: 2017-12-16 | Disposition: A | Discharge status: Home / Self Care | Payer: Medicare HMO | Attending: Emergency Medicine | Admitting: Emergency Medicine

## 2017-12-16 ENCOUNTER — Other Ambulatory Visit: Payer: Self-pay

## 2017-12-16 ENCOUNTER — Encounter (HOSPITAL_BASED_OUTPATIENT_CLINIC_OR_DEPARTMENT_OTHER): Payer: Self-pay | Admitting: Emergency Medicine

## 2017-12-16 DIAGNOSIS — I10 Essential (primary) hypertension: Secondary | ICD-10-CM | POA: Diagnosis not present

## 2017-12-16 DIAGNOSIS — Z79899 Other long term (current) drug therapy: Secondary | ICD-10-CM | POA: Diagnosis not present

## 2017-12-16 DIAGNOSIS — T783XXA Angioneurotic edema, initial encounter: Secondary | ICD-10-CM | POA: Diagnosis present

## 2017-12-16 DIAGNOSIS — F17210 Nicotine dependence, cigarettes, uncomplicated: Secondary | ICD-10-CM | POA: Insufficient documentation

## 2017-12-16 MED ORDER — DIPHENHYDRAMINE HCL 50 MG/ML IJ SOLN
25.0000 mg | Freq: Once | INTRAMUSCULAR | Status: AC
  Administered 2017-12-16: 25 mg via INTRAVENOUS
  Filled 2017-12-16: qty 1

## 2017-12-16 MED ORDER — METHYLPREDNISOLONE SODIUM SUCC 125 MG IJ SOLR
125.0000 mg | Freq: Once | INTRAMUSCULAR | Status: AC
  Administered 2017-12-16: 125 mg via INTRAVENOUS
  Filled 2017-12-16: qty 2

## 2017-12-16 MED ORDER — FAMOTIDINE IN NACL 20-0.9 MG/50ML-% IV SOLN
20.0000 mg | Freq: Once | INTRAVENOUS | Status: AC
  Administered 2017-12-16: 20 mg via INTRAVENOUS
  Filled 2017-12-16: qty 50

## 2017-12-16 NOTE — Discharge Instructions (Addendum)
Benadryl 3 times per day today, and tomorrow. Do not take lisinopril. Contact your primary care physician to discuss new blood pressure medication. Return to ER at any time with any perceived worsening.

## 2017-12-16 NOTE — ED Triage Notes (Signed)
Woke up with swelling to her tongue this morning. Takes lisinopril. Denies SOB

## 2017-12-16 NOTE — ED Provider Notes (Signed)
[facility] EMERGENCY DEPARTMENT Provider Note   CSN: [id number] Arrival date & time: 12/16/17  1012     History   Chief Complaint Chief Complaint  Patient presents with  . Angioedema    HPI Mary Bradshaw is a 67 y.o. female.  Chief complaint is tongue swelling  HPI: This is a 67 year old female.  She is been on lisinopril for years.  She started noticing some tongue swelling upon awakening this morning.  It worsened for about an hour.  It has not changed over the last 2 hours.  She is still able to swallow, speak.  She is not drooling.  No external facial or lip swelling.  No difficulty breathing.  Past Medical History:  Diagnosis Date  . Headache   . Hypertension     There are no active problems to display for this patient.   Past Surgical History:  Procedure Laterality Date  . ABDOMINAL HYSTERECTOMY    . APPENDECTOMY    . BREAST LUMPECTOMY     left side     OB History    Gravida  2   Para  2   Term  2   Preterm      AB      Living  2     SAB      TAB      Ectopic      Multiple      Live Births               Home Medications    Prior to Admission medications   Medication Sig Start Date End Date Taking? Authorizing Provider  albuterol (PROVENTIL HFA;VENTOLIN HFA) 108 (90 Base) MCG/ACT inhaler Inhale 1-2 puffs into the lungs every 6 (six) hours as needed for wheezing. 07/13/17   Rolland Porter, MD  benzonatate (TESSALON) 100 MG capsule Take 1 capsule (100 mg total) by mouth every 8 (eight) hours. 07/13/17   Rolland Porter, MD  cefdinir (OMNICEF) 300 MG capsule Take 300 mg by mouth 2 (two) times daily.    [provider]  cyclobenzaprine (FLEXERIL) 5 MG tablet Take 1 tablet (5 mg total) by mouth 3 (three) times daily as needed for muscle spasms. 12/16/14   Mabe, Latanya Maudlin, MD  docusate sodium (COLACE) 100 MG capsule Take 1 capsule (100 mg total) by mouth every 12 (twelve) hours. 07/13/17   Rolland Porter, MD  HYDROcodone-acetaminophen  (NORCO/VICODIN) 5-325 MG tablet Take 1 tablet by mouth every 4 (four) hours as needed. 11/28/17   Georgiana Shore, PA-C  lisinopril-hydrochlorothiazide (PRINZIDE,ZESTORETIC) 20-12.5 MG per tablet Take 1 tablet by mouth daily.    [provider]  ondansetron (ZOFRAN ODT) 8 MG disintegrating tablet 8mg  ODT q8 hours prn nausea, not for pregnant patients Patient taking differently: Take 8 mg by mouth every 8 (eight) hours as needed for nausea or vomiting. 8mg  ODT q8 hours prn nausea, not for pregnant patients 10/26/14   Palumbo, April, MD  oxyCODONE-acetaminophen (ROXICET) 5-325 MG per tablet Take 1-2 tablets by mouth every 4 (four) hours as needed for severe pain. 12/16/14   Mabe, Latanya Maudlin, MD  predniSONE (DELTASONE) 2.5 MG tablet Take 2.5 mg by mouth daily with breakfast.    [provider]  promethazine (PHENERGAN) 25 MG tablet Take 0.5-1 tablets (12.5-25 mg total) by mouth every 6 (six) hours as needed. 10/26/14   Armando Reichert, CNM    Family History No family history on file.  Social History Social History   Tobacco  Use  . Smoking status: Current Every Day Smoker    Packs/day: 1.00    Types: Cigarettes  . Smokeless tobacco: Never Used  Substance Use Topics  . Alcohol use: No  . Drug use: No     Allergies   Patient has no known allergies.   Review of Systems Review of Systems  Constitutional: Negative for appetite change, chills, diaphoresis, fatigue and fever.  HENT: Negative for mouth sores, sore throat and trouble swallowing.        Tongue swelling  Eyes: Negative for visual disturbance.  Respiratory: Negative for cough, chest tightness, shortness of breath and wheezing.   Cardiovascular: Negative for chest pain.  Gastrointestinal: Negative for abdominal distention, abdominal pain, diarrhea, nausea and vomiting.  Endocrine: Negative for polydipsia, polyphagia and polyuria.  Genitourinary: Negative for dysuria, frequency and hematuria.  Musculoskeletal:  Negative for gait problem.  Skin: Negative for color change, pallor and rash.  Neurological: Negative for dizziness, syncope, light-headedness and headaches.  Hematological: Does not bruise/bleed easily.  Psychiatric/Behavioral: Negative for behavioral problems and confusion.     Physical Exam Updated Vital Signs BP (!) 150/100   Pulse 77   Temp 98.7 F (37.1 C) (Oral)   Resp 20   Ht 4\' 11"  (1.499 m)   Wt 45.4 kg (100 lb)   LMP 10/25/2014 Comment: hysterectomy  SpO2 97%   BMI 20.20 kg/m   Physical Exam  Constitutional: She is oriented to person, place, and time. She appears well-developed and well-nourished. No distress.  HENT:  Head: Normocephalic.  The right half of the tongue shows shiny soft tissue swelling and angioedema.  Floor the mouth, buccal mucosa, posterior pharynx all appear normal.  She is not stridorous.  She is not drooling or malposition.  She is handling secretions.  She is able to drink water.  Eyes: Pupils are equal, round, and reactive to light. Conjunctivae are normal. No scleral icterus.  Neck: Normal range of motion. Neck supple. No thyromegaly present.  Cardiovascular: Normal rate and regular rhythm. Exam reveals no gallop and no friction rub.  No murmur heard. Pulmonary/Chest: Effort normal and breath sounds normal. No respiratory distress. She has no wheezes. She has no rales.  Abdominal: Soft. Bowel sounds are normal. She exhibits no distension. There is no tenderness. There is no rebound.  Musculoskeletal: Normal range of motion.  Neurological: She is alert and oriented to person, place, and time.  Skin: Skin is warm and dry. No rash noted.  Psychiatric: She has a normal mood and affect. Her behavior is normal.     ED Treatments / Results  Labs (all labs ordered are listed, but only abnormal results are displayed) Labs Reviewed - No data to display  EKG None  Radiology No results found.  Procedures Procedures (including critical care  time)  Medications Ordered in ED Medications  diphenhydrAMINE (BENADRYL) injection 25 mg (25 mg Intravenous Given 12/16/17 1035)  methylPREDNISolone sodium succinate (SOLU-MEDROL) 125 mg/2 mL injection 125 mg (125 mg Intravenous Given 12/16/17 1040)  famotidine (PEPCID) IVPB 20 mg premix (0 mg Intravenous Stopped 12/16/17 1123)     Initial Impression / Assessment and Plan / ED Course  I have reviewed the triage vital signs and the nursing notes.  Pertinent labs & imaging results that were available during my care of the patient were reviewed by me and considered in my medical decision making (see chart for details).    Likely ACE induced angioedema.  Given site Medrol, Bentyl, Pepcid.  Observe for  2 and half hours.  On recheck the size of her right tongue has decreased by approximately 50%.  It is not normal but is markedly improved.  Remainder the exam shows no other areas of swelling.  I think she is appropriate for discharge home.  Never take ACE inhibitors including lisinopril.  Benadryl today and tomorrow.  Recheck with any perceived worsening of swelling or other symptoms.  Contact primary care regarding direction regarding blood pressures  Final Clinical Impressions(s) / ED Diagnoses   Final diagnoses:  Angioedema, initial encounter    ED Discharge Orders    None       Rolland PorterJames, Javaya [place], MD 12/16/17 1254

## 2019-02-08 ENCOUNTER — Other Ambulatory Visit: Payer: Self-pay

## 2019-02-08 ENCOUNTER — Emergency Department (HOSPITAL_BASED_OUTPATIENT_CLINIC_OR_DEPARTMENT_OTHER)
Admission: EM | Admit: 2019-02-08 | Discharge: 2019-02-08 | Disposition: A | Discharge status: Home / Self Care | Payer: Medicare HMO | Attending: Emergency Medicine | Admitting: Emergency Medicine

## 2019-02-08 ENCOUNTER — Encounter (HOSPITAL_BASED_OUTPATIENT_CLINIC_OR_DEPARTMENT_OTHER): Payer: Self-pay | Admitting: Adult Health

## 2019-02-08 DIAGNOSIS — I10 Essential (primary) hypertension: Secondary | ICD-10-CM | POA: Diagnosis not present

## 2019-02-08 DIAGNOSIS — Z79899 Other long term (current) drug therapy: Secondary | ICD-10-CM | POA: Insufficient documentation

## 2019-02-08 DIAGNOSIS — Z20828 Contact with and (suspected) exposure to other viral communicable diseases: Secondary | ICD-10-CM | POA: Diagnosis not present

## 2019-02-08 DIAGNOSIS — F17210 Nicotine dependence, cigarettes, uncomplicated: Secondary | ICD-10-CM | POA: Insufficient documentation

## 2019-02-08 DIAGNOSIS — R42 Dizziness and giddiness: Secondary | ICD-10-CM | POA: Diagnosis not present

## 2019-02-08 MED ORDER — OXYMETAZOLINE HCL 0.05 % NA SOLN
1.0000 | Freq: Once | NASAL | Status: AC
  Administered 2019-02-08: 1 via NASAL
  Filled 2019-02-08: qty 30

## 2019-02-08 NOTE — ED Triage Notes (Signed)
Pt reports that she has a lot of sinus drainage and that her ears kept "closing in on her yesterday" She reports that she became dizzy yesterday morning and it feels like she is on a boat. Getting up to walk makes the dizziness start. Sitting down makes the dizziness better. The dizziness comes and goes. Laying down mimproves it completely.

## 2019-02-08 NOTE — ED Provider Notes (Signed)
[facility] EMERGENCY DEPARTMENT Provider Note   CSN: [id number] Arrival date & time: 02/08/19  1322    History   Chief Complaint Chief Complaint  Patient presents with  . Dizziness    HPI Mary Bradshaw is a 68 y.o. female.  History provided by patient.  She notes that for the last few days, she has been having dizziness that worsens with movement.  States that it comes and goes.  Notes that occasionally when she is walking, she will feel like she is falling to the left.  Reports a history of peripheral vertigo, states that this feels similar.  Denies numbness tingling, weakness of extremities.  Denies loss of consciousness, head trauma.denies dizziness at present.  States that for the last few days, she is also been feeling congested and fullness in her head and left ear.  She has been taking over-the-counter Sudafed with mild improvement in her symptoms.  She denies any fevers.  She has a chronic cough, but has not had worsening in this.  Denies shortness of breath, chest pain.  Denies known sick contacts or known COVID-19 contacts.  She states that her largest concern, was that the symptoms were the start of a COVID-19 infection.   Past Medical History:  Diagnosis Date  . Headache   . Hypertension     There are no active problems to display for this patient.   Past Surgical History:  Procedure Laterality Date  . ABDOMINAL HYSTERECTOMY    . APPENDECTOMY    . BREAST LUMPECTOMY     left side     OB History    Gravida  2   Para  2   Term  2   Preterm      AB      Living  2     SAB      TAB      Ectopic      Multiple      Live Births               Home Medications    Prior to Admission medications   Medication Sig Start Date End Date Taking? Authorizing Provider  albuterol (PROVENTIL HFA;VENTOLIN HFA) 108 (90 Base) MCG/ACT inhaler Inhale 1-2 puffs into the lungs every 6 (six) hours as needed for wheezing. 07/13/17   Tanna Furry, MD   benzonatate (TESSALON) 100 MG capsule Take 1 capsule (100 mg total) by mouth every 8 (eight) hours. 07/13/17   Tanna Furry, MD  cefdinir (OMNICEF) 300 MG capsule Take 300 mg by mouth 2 (two) times daily.    [provider]  cyclobenzaprine (FLEXERIL) 5 MG tablet Take 1 tablet (5 mg total) by mouth 3 (three) times daily as needed for muscle spasms. 12/16/14   Mabe, Forbes Cellar, MD  docusate sodium (COLACE) 100 MG capsule Take 1 capsule (100 mg total) by mouth every 12 (twelve) hours. 07/13/17   Tanna Furry, MD  HYDROcodone-acetaminophen (NORCO/VICODIN) 5-325 MG tablet Take 1 tablet by mouth every 4 (four) hours as needed. 11/28/17   Emeline General, PA-C  lisinopril-hydrochlorothiazide (PRINZIDE,ZESTORETIC) 20-12.5 MG per tablet Take 1 tablet by mouth daily.    [provider]  ondansetron (ZOFRAN ODT) 8 MG disintegrating tablet 8mg  ODT q8 hours prn nausea, not for pregnant patients Patient taking differently: Take 8 mg by mouth every 8 (eight) hours as needed for nausea or vomiting. 8mg  ODT q8 hours prn nausea, not for pregnant patients 10/26/14   Randal Buba, April, MD  oxyCODONE-acetaminophen (  ROXICET) 5-325 MG per tablet Take 1-2 tablets by mouth every 4 (four) hours as needed for severe pain. 12/16/14   Mabe, Latanya MaudlinMartha L, MD  predniSONE (DELTASONE) 2.5 MG tablet Take 2.5 mg by mouth daily with breakfast.    [provider]  promethazine (PHENERGAN) 25 MG tablet Take 0.5-1 tablets (12.5-25 mg total) by mouth every 6 (six) hours as needed. 10/26/14   Armando ReichertHogan, Heather D, CNM    Family History History reviewed. No pertinent family history.  Social History Social History   Tobacco Use  . Smoking status: Current Every Day Smoker    Packs/day: 1.00    Types: Cigarettes  . Smokeless tobacco: Never Used  Substance Use Topics  . Alcohol use: No  . Drug use: No     Allergies   Patient has no known allergies.   Review of Systems Review of Systems  Constitutional: Negative for  appetite change, fatigue and fever.  HENT: Positive for congestion, ear pain (fullness) and sinus pressure. Negative for sore throat.   Eyes: Negative for visual disturbance.  Respiratory: Positive for cough (chronic). Negative for shortness of breath and wheezing.   Cardiovascular: Negative for chest pain and leg swelling.  Gastrointestinal: Positive for nausea. Negative for abdominal pain, diarrhea and vomiting.  Genitourinary: Negative for dysuria and hematuria.  Musculoskeletal: Negative for back pain and neck pain.  Neurological: Positive for dizziness. Negative for syncope, facial asymmetry, speech difficulty, weakness, numbness and headaches.     Physical Exam Updated Vital Signs BP (!) 149/83 (BP Location: Right Arm)   Pulse 79   Temp 98.2 F (36.8 C) (Oral)   Resp 18   Ht 5' (1.524 m)   Wt 44.5 kg   LMP 10/25/2014 Comment: hysterectomy  SpO2 100%   BMI 19.14 kg/m   Physical Exam Constitutional:      General: She is not in acute distress.    Appearance: Normal appearance. She is not ill-appearing.  HENT:     Head: Normocephalic and atraumatic.     Right Ear: Tympanic membrane, ear canal and external ear normal. There is no impacted cerumen.     Left Ear: Tympanic membrane, ear canal and external ear normal. There is no impacted cerumen.     Nose: Congestion present.     Mouth/Throat:     Mouth: Mucous membranes are moist.     Pharynx: No oropharyngeal exudate or posterior oropharyngeal erythema.  Eyes:     Extraocular Movements: Extraocular movements intact.     Conjunctiva/sclera: Conjunctivae normal.     Pupils: Pupils are equal, round, and reactive to light.  Neck:     Musculoskeletal: Normal range of motion and neck supple.  Cardiovascular:     Rate and Rhythm: Normal rate and regular rhythm.     Heart sounds: Murmur (aortic stenosis) present.  Pulmonary:     Effort: Pulmonary effort is normal. No respiratory distress.     Breath sounds: Normal breath  sounds.  Abdominal:     General: Abdomen is flat. Bowel sounds are normal. There is no distension.     Palpations: Abdomen is soft.     Tenderness: There is no abdominal tenderness.  Skin:    General: Skin is warm and dry.  Neurological:     General: No focal deficit present.     Mental Status: She is alert and oriented to person, place, and time. Mental status is at baseline.     Cranial Nerves: No cranial nerve deficit.  Sensory: No sensory deficit.     Motor: No weakness.     Coordination: Coordination normal.     Gait: Gait normal.     Comments: Negative Dix-Hallpike bilaterally, negative finger-to-nose, negative Romberg, negative heel-to-shin.  Ambulates in room without difficulty.      ED Treatments / Results  Labs (all labs ordered are listed, but only abnormal results are displayed) Labs Reviewed  NOVEL CORONAVIRUS, NAA (HOSPITAL ORDER, SEND-OUT TO REF LAB)    EKG None  Radiology No results found.  Procedures Procedures (including critical care time)  Medications Ordered in ED Medications  oxymetazoline (AFRIN) 0.05 % nasal spray 1 spray (has no administration in time range)     Initial Impression / Assessment and Plan / ED Course  I have reviewed the triage vital signs and the nursing notes.  Pertinent labs & imaging results that were available during my care of the patient were reviewed by me and considered in my medical decision making (see chart for details).  Mary Bradshaw is a [age] year old female with history of hypertension, emphysema, aortic atherosclerosis, who presents with dizziness that worsens with movement, congestion, sinus fullness for the last 2 to 3 days.  Her neurologic exam is very reassuring, therefore head imaging not indicated.  Patient does not currently have symptoms, and given negative Dix-Hallpike, and age, would hold off on meclizine.  No exam maneuvers elicited patient's dizziness.  Ear exam benign.  Given patient's history of  peripheral vertigo, this is likely cause in setting of sinusitis.  Will give patient Afrin.  Patient also concerned about COVID-19 infection, therefore will order send out test.  Will advise patient that until COVID-19 test results, she should quarantine and avoid contact with others in her household.  This was discussed with patient, she agreed to plan.  She was given appropriate return precautions, including worsening dizziness, loss of consciousness, associated headache or vision changes, focal weakness, paresthesias.  She voiced understanding.  She was discharged home and advised follow-up with PCP.   Final Clinical Impressions(s) / ED Diagnoses   Final diagnoses:  Dizziness    ED Discharge Orders    None       Unknown JimMeccariello, Beverlie Kurihara J, DO 02/08/19 1450    Maia PlanLong, Joshua G, MD 02/08/19 2038

## 2019-02-08 NOTE — Discharge Instructions (Addendum)
You likely have dizziness from congestion.  You should pick up Afrin nose spray over-the-counter to use for the next few days to help with this improvement.  Should you have worsening any dizziness, loss of consciousness, new headache, change in your vision, weakness on 1 side of your body or the other, numbness or tingling in your extremities, you should be seen right away.  He should follow-up with your primary care doctor if you have worsening of symptoms or no improvement of symptoms in the next 5 days.  Your PCP should follow-up on the results of your COVID test, this will likely take greater than 48 hours.  Until the test results, you should quarantine, avoid contact with others in your household.

## 2019-02-10 LAB — NOVEL CORONAVIRUS, NAA (HOSP ORDER, SEND-OUT TO REF LAB; TAT 18-24 HRS): SARS-CoV-2, NAA: NOT DETECTED

## 2019-04-11 ENCOUNTER — Encounter (HOSPITAL_BASED_OUTPATIENT_CLINIC_OR_DEPARTMENT_OTHER): Payer: Self-pay

## 2019-04-11 ENCOUNTER — Emergency Department (HOSPITAL_BASED_OUTPATIENT_CLINIC_OR_DEPARTMENT_OTHER)
Admission: EM | Admit: 2019-04-11 | Discharge: 2019-04-11 | Disposition: A | Discharge status: Home / Self Care | Payer: Medicare HMO | Attending: Emergency Medicine | Admitting: Emergency Medicine

## 2019-04-11 ENCOUNTER — Emergency Department (HOSPITAL_BASED_OUTPATIENT_CLINIC_OR_DEPARTMENT_OTHER): Payer: Medicare HMO

## 2019-04-11 ENCOUNTER — Other Ambulatory Visit: Payer: Self-pay

## 2019-04-11 DIAGNOSIS — R05 Cough: Secondary | ICD-10-CM | POA: Diagnosis present

## 2019-04-11 DIAGNOSIS — Z20828 Contact with and (suspected) exposure to other viral communicable diseases: Secondary | ICD-10-CM | POA: Diagnosis not present

## 2019-04-11 DIAGNOSIS — Z79899 Other long term (current) drug therapy: Secondary | ICD-10-CM | POA: Insufficient documentation

## 2019-04-11 DIAGNOSIS — I10 Essential (primary) hypertension: Secondary | ICD-10-CM | POA: Diagnosis not present

## 2019-04-11 DIAGNOSIS — F17210 Nicotine dependence, cigarettes, uncomplicated: Secondary | ICD-10-CM | POA: Insufficient documentation

## 2019-04-11 DIAGNOSIS — J189 Pneumonia, unspecified organism: Secondary | ICD-10-CM | POA: Insufficient documentation

## 2019-04-11 LAB — BASIC METABOLIC PANEL
Anion gap: 14 (ref 5–15)
BUN: 15 mg/dL (ref 8–23)
CO2: 23 mmol/L (ref 22–32)
Calcium: 9.6 mg/dL (ref 8.9–10.3)
Chloride: 101 mmol/L (ref 98–111)
Creatinine, Ser: 0.7 mg/dL (ref 0.44–1.00)
GFR calc Af Amer: >60 mL/min (ref >60)
GFR calc non Af Amer: >60 mL/min (ref >60)
Glucose, Bld: 87 mg/dL (ref 70–99)
Potassium: 3.6 mmol/L (ref 3.5–5.1)
Sodium: 138 mmol/L (ref 135–145)

## 2019-04-11 LAB — CBC WITH DIFFERENTIAL/PLATELET
Abs Immature Granulocytes: 0.02 10*3/uL (ref 0.00–0.07)
Basophils Absolute: 0 10*3/uL (ref 0.0–0.1)
Basophils Relative: 1 %
Eosinophils Absolute: 0 10*3/uL (ref 0.0–0.5)
Eosinophils Relative: 1 %
HCT: 45.1 % (ref 36.0–46.0)
Hemoglobin: 14.4 g/dL (ref 12.0–15.0)
Immature Granulocytes: 0 %
Lymphocytes Relative: 26 %
Lymphs Abs: 1.8 10*3/uL (ref 0.7–4.0)
MCH: 30.7 pg (ref 26.0–34.0)
MCHC: 31.9 g/dL (ref 30.0–36.0)
MCV: 96.2 fL (ref 80.0–100.0)
Monocytes Absolute: 0.5 10*3/uL (ref 0.1–1.0)
Monocytes Relative: 7 %
Neutro Abs: 4.3 10*3/uL (ref 1.7–7.7)
Neutrophils Relative %: 65 %
Platelets: 396 10*3/uL (ref 150–400)
RBC: 4.69 MIL/uL (ref 3.87–5.11)
RDW: 13.2 % (ref 11.5–15.5)
WBC: 6.6 10*3/uL (ref 4.0–10.5)
nRBC: 0 % (ref 0.0–0.2)

## 2019-04-11 LAB — TROPONIN I (HIGH SENSITIVITY): Troponin I (High Sensitivity): 5 ng/L (ref <18)

## 2019-04-11 LAB — SARS CORONAVIRUS 2 (TAT 6-24 HRS): SARS Coronavirus 2: NEGATIVE

## 2019-04-11 MED ORDER — BENZONATATE 100 MG PO CAPS
100.0000 mg | ORAL_CAPSULE | Freq: Once | ORAL | Status: AC
  Administered 2019-04-11: 100 mg via ORAL
  Filled 2019-04-11: qty 1

## 2019-04-11 MED ORDER — AZITHROMYCIN 250 MG PO TABS
500.0000 mg | ORAL_TABLET | Freq: Once | ORAL | Status: AC
  Administered 2019-04-11: 500 mg via ORAL
  Filled 2019-04-11: qty 2

## 2019-04-11 MED ORDER — SODIUM CHLORIDE 0.9 % IV BOLUS
1000.0000 mL | Freq: Once | INTRAVENOUS | Status: AC
  Administered 2019-04-11: 1000 mL via INTRAVENOUS

## 2019-04-11 MED ORDER — AZITHROMYCIN 250 MG PO TABS: 250.0000 mg | ORAL_TABLET | Freq: Every day | ORAL | 0 refills | Status: AC

## 2019-04-11 MED ORDER — SODIUM CHLORIDE 0.9 % IV SOLN
1.0000 g | Freq: Once | INTRAVENOUS | Status: AC
  Administered 2019-04-11: 1 g via INTRAVENOUS
  Filled 2019-04-11: qty 10

## 2019-04-11 NOTE — Discharge Instructions (Addendum)
You have a pneumonia on your xray.  You should take your antibiotics as prescribed until you finish the whole course.  Your next dose of antibiotics starts TOMORROW MORNING.  It is very important you follow up with your doctor to ensure that your infection is improving.  You were also swabbed for covid-19.  You should self-quarantine at home until you know your results.  This can take 1-5 days.  You can call back into the ER if you want to confirm your results.

## 2019-04-11 NOTE — ED Triage Notes (Signed)
C/o flu like sx x 2 weeks-CP started yesterday, right arm pain x 1 week-NAD-steady gait

## 2019-04-11 NOTE — ED Notes (Signed)
Pain only when she moves around and when she cough.

## 2019-04-11 NOTE — ED Provider Notes (Signed)
[facility] EMERGENCY DEPARTMENT Provider Note   CSN: [id number] Arrival date & time: 04/11/19  1125     History   Chief Complaint Chief Complaint  Patient presents with  . Cough    HPI Mary Bradshaw is a 68 y.o. female with a history of bronchitis, hypertension presented to emergency department with cough and left-sided chest pain.  She reports that multiple members of her family were sick 2 weeks ago with cough myalgias and fevers.  This included grandchildren and children that were in the house.  She subsequently developed cough and myalgia 2 weeks ago.  She is reporting persistent productive coughing.  She is having subjective fevers and chills at home.  She is reporting a pressure sensation in left side of her chest that began yesterday.  She is unsure if she has had these symptoms before.  She says the pressure is in her left lower breast and does not radiate anywhere.  It comes and goes.  She currently is experiencing no chest pain or pressure.  She reports no personal cardiac history of MI or stents.  She reports no history of angina.  She states she has a history of bronchitis and continues to smoke 1 pack of cigarettes per day.  She has a rescue inhaler which she uses at home as needed for her bronchitis.  No hemoptysis or asymmetric LE edema. Patient denies personal or family history of DVT or PE. No recent hormone use (including OCP); travel for >6 hours; prolonged immobilization for greater than 3 days; surgeries or trauma in the last 4 weeks; or malignancy with treatment within 6 months.     HPI  Past Medical History:  Diagnosis Date  . Headache   . Hypertension     There are no active problems to display for this patient.   Past Surgical History:  Procedure Laterality Date  . ABDOMINAL HYSTERECTOMY    . APPENDECTOMY    . BREAST LUMPECTOMY     left side     OB History    Gravida  2   Para  2   Term  2   Preterm      AB      Living  2      SAB      TAB      Ectopic      Multiple      Live Births               Home Medications    Prior to Admission medications   Medication Sig Start Date End Date Taking? Authorizing Provider  lisinopril-hydrochlorothiazide (PRINZIDE,ZESTORETIC) 20-12.5 MG per tablet Take 1 tablet by mouth daily.   Yes [provider]  albuterol (PROVENTIL HFA;VENTOLIN HFA) 108 (90 Base) MCG/ACT inhaler Inhale 1-2 puffs into the lungs every 6 (six) hours as needed for wheezing. 07/13/17   Rolland Porter, MD  azithromycin (ZITHROMAX) 250 MG tablet Take 1 tablet (250 mg total) by mouth daily for 4 days. Take first 2 tablets together, then 1 every day until finished. 04/12/19 04/16/19  Terald Sleeper, MD    Family History No family history on file.  Social History Social History   Tobacco Use  . Smoking status: Current Every Day Smoker    Packs/day: 1.00    Types: Cigarettes  . Smokeless tobacco: Never Used  Substance Use Topics  . Alcohol use: No  . Drug use: No     Allergies   Patient has no known  allergies.   Review of Systems Review of Systems  Constitutional: Positive for appetite change, chills, fatigue and fever.  HENT: Positive for congestion and rhinorrhea. Negative for ear pain and sore throat.   Eyes: Negative for photophobia and visual disturbance.  Respiratory: Positive for cough and shortness of breath.   Cardiovascular: Positive for chest pain. Negative for palpitations.  Gastrointestinal: Positive for nausea. Negative for abdominal pain, constipation, diarrhea and vomiting.  Genitourinary: Negative for dysuria and hematuria.  Musculoskeletal: Positive for arthralgias, back pain and myalgias. Negative for neck pain.  Skin: Negative for pallor and rash.  Neurological: Positive for headaches. Negative for syncope.  Psychiatric/Behavioral: Negative for agitation and confusion.  All other systems reviewed and are negative.    Physical Exam Updated Vital  Signs BP (!) 168/96   Pulse 93   Temp 97.9 F (36.6 C) (Oral)   Resp (!) 23   Ht 5' (1.524 m)   Wt 49.4 kg   LMP 10/25/2014 Comment: hysterectomy  SpO2 95%   BMI 21.29 kg/m   Physical Exam Vitals signs and nursing note reviewed.  Constitutional:      General: She is not in acute distress.    Appearance: She is well-developed.     Comments: Frail,thin, wet productive cough  HENT:     Head: Normocephalic and atraumatic.  Eyes:     Conjunctiva/sclera: Conjunctivae normal.  Neck:     Musculoskeletal: Neck supple.  Cardiovascular:     Rate and Rhythm: Normal rate and regular rhythm.     Pulses: Normal pulses.  Pulmonary:     Effort: Pulmonary effort is normal. No respiratory distress.     Comments: 98% on room air Coarse breath sounds bilaterally No wheezing Persistent thick coughing on exam Abdominal:     General: There is no distension.     Palpations: Abdomen is soft.     Tenderness: There is no abdominal tenderness.  Skin:    General: Skin is warm and dry.  Neurological:     Mental Status: She is alert.      ED Treatments / Results  Labs (all labs ordered are listed, but only abnormal results are displayed) Labs Reviewed  SARS CORONAVIRUS 2 (TAT 6-24 HRS)  CBC WITH DIFFERENTIAL/PLATELET  BASIC METABOLIC PANEL  TROPONIN I (HIGH SENSITIVITY)    EKG None  Radiology Dg Chest 1 View  Result Date: 04/11/2019 CLINICAL DATA:  Productive cough. EXAM: CHEST  1 VIEW COMPARISON:  July 13, 2017 FINDINGS: Cardiomediastinal silhouette is normal. Calcific atherosclerotic disease and tortuosity of the aorta. Right lower lung field focal airspace consolidation likely represents pneumonia. No pneumothorax or pleural effusion. Osseous structures are without acute abnormality. Soft tissues are grossly normal. IMPRESSION: 1. Right lower lung field focal airspace consolidation likely represents pneumonia. 2. Calcific atherosclerotic disease and tortuosity of the aorta.  Electronically Signed   By: Ted Mcalpineobrinka  Dimitrova M.D.   On: 04/11/2019 14:26    Procedures Procedures (including critical care time)  Medications Ordered in ED Medications  sodium chloride 0.9 % bolus 1,000 mL (0 mLs Intravenous Stopped 04/11/19 1503)  benzonatate (TESSALON) capsule 100 mg (100 mg Oral Given 04/11/19 1341)  cefTRIAXone (ROCEPHIN) 1 g in sodium chloride 0.9 % 100 mL IVPB (0 g Intravenous Stopped 04/11/19 1605)  azithromycin (ZITHROMAX) tablet 500 mg (500 mg Oral Given 04/11/19 1516)     Initial Impression / Assessment and Plan / ED Course  I have reviewed the triage vital signs and the nursing notes.  Pertinent labs &  imaging results that were available during my care of the patient were reviewed by me and considered in my medical decision making (see chart for details).  87-year-old female presented emergency department 2 weeks of viral type syndrome here with productive cough and now chest pain that is pleuritic and associated with her coughing.  The chest pain is currently not present.  I suspect this may be bronchitis versus bacterial pneumonia versus COVID-19.  We will swab her and put her on airborne precautions.  Obtain a chest x-ray as well as blood work.  Given her age and her lack of recent cardiac work-up, also obtain an EKG and troponin.  Very low suspicion for aortic dissection or pulmonary embolism at this time.  Clinical Course as of Apr 11 1707  Fri Apr 11, 2019  1439 IMPRESSION: 1. Right lower lung field focal airspace consolidation likely represents pneumonia. 2. Calcific atherosclerotic disease and tortuosity of the aorta.   [MT]  1550 EKG was printed and given to me which shows normal sinus rhythm no ST elevations.  Very low suspicion for ACS at this time.  We will discharge her home on 4 more days of azithromycin now she is completed her first round of antibiotics here.   [MT]  [place] was evaluated in Emergency Department on 04/11/2019 for  the symptoms described in the history of present illness. She was evaluated in the context of the global COVID-19 pandemic, which necessitated consideration that the patient might be at risk for infection with the SARS-CoV-2 virus that causes COVID-19. Institutional protocols and algorithms that pertain to the evaluation of patients at risk for COVID-19 are in a state of rapid change based on information released by regulatory bodies including the CDC and federal and state organizations. These policies and algorithms were followed during the patient's care in the ED.     [MT]    Clinical Course User Index [MT] Joshua Soulier, Carola Rhine, MD     Final Clinical Impressions(s) / ED Diagnoses   Final diagnoses:  Community acquired pneumonia of right lower lobe of lung    ED Discharge Orders         Ordered    azithromycin (ZITHROMAX) 250 MG tablet  Daily     04/11/19 1553           Wyvonnia Dusky, MD 04/11/19 1708

## 2020-07-14 ENCOUNTER — Encounter (HOSPITAL_BASED_OUTPATIENT_CLINIC_OR_DEPARTMENT_OTHER): Payer: Self-pay | Admitting: Emergency Medicine

## 2020-07-14 ENCOUNTER — Emergency Department (HOSPITAL_BASED_OUTPATIENT_CLINIC_OR_DEPARTMENT_OTHER): Payer: Medicare HMO

## 2020-07-14 ENCOUNTER — Emergency Department (HOSPITAL_BASED_OUTPATIENT_CLINIC_OR_DEPARTMENT_OTHER)
Admission: EM | Admit: 2020-07-14 | Discharge: 2020-07-14 | Disposition: A | Discharge status: Home / Self Care | Payer: Medicare HMO | Attending: Emergency Medicine | Admitting: Emergency Medicine

## 2020-07-14 ENCOUNTER — Other Ambulatory Visit: Payer: Self-pay

## 2020-07-14 DIAGNOSIS — R110 Nausea: Secondary | ICD-10-CM | POA: Insufficient documentation

## 2020-07-14 DIAGNOSIS — F17210 Nicotine dependence, cigarettes, uncomplicated: Secondary | ICD-10-CM | POA: Diagnosis not present

## 2020-07-14 DIAGNOSIS — R519 Headache, unspecified: Secondary | ICD-10-CM | POA: Diagnosis present

## 2020-07-14 DIAGNOSIS — R531 Weakness: Secondary | ICD-10-CM

## 2020-07-14 DIAGNOSIS — I10 Essential (primary) hypertension: Secondary | ICD-10-CM | POA: Diagnosis not present

## 2020-07-14 DIAGNOSIS — Z79899 Other long term (current) drug therapy: Secondary | ICD-10-CM | POA: Diagnosis not present

## 2020-07-14 DIAGNOSIS — R42 Dizziness and giddiness: Secondary | ICD-10-CM

## 2020-07-14 DIAGNOSIS — U071 COVID-19: Secondary | ICD-10-CM | POA: Insufficient documentation

## 2020-07-14 LAB — CBC WITH DIFFERENTIAL/PLATELET
Abs Immature Granulocytes: 0 10*3/uL (ref 0.00–0.07)
Basophils Absolute: 0 10*3/uL (ref 0.0–0.1)
Basophils Relative: 1 %
Eosinophils Absolute: 0 10*3/uL (ref 0.0–0.5)
Eosinophils Relative: 0 %
HCT: 41.9 % (ref 36.0–46.0)
Hemoglobin: 14.2 g/dL (ref 12.0–15.0)
Immature Granulocytes: 0 %
Lymphocytes Relative: 33 %
Lymphs Abs: 1 10*3/uL (ref 0.7–4.0)
MCH: 32.6 pg (ref 26.0–34.0)
MCHC: 33.9 g/dL (ref 30.0–36.0)
MCV: 96.1 fL (ref 80.0–100.0)
Monocytes Absolute: 0.5 10*3/uL (ref 0.1–1.0)
Monocytes Relative: 17 %
Neutro Abs: 1.6 10*3/uL — ABNORMAL LOW (ref 1.7–7.7)
Neutrophils Relative %: 49 %
Platelets: 236 10*3/uL (ref 150–400)
RBC: 4.36 MIL/uL (ref 3.87–5.11)
RDW: 14.3 % (ref 11.5–15.5)
WBC: 3.1 10*3/uL — ABNORMAL LOW (ref 4.0–10.5)
nRBC: 0 % (ref 0.0–0.2)

## 2020-07-14 LAB — BASIC METABOLIC PANEL
Anion gap: 11 (ref 5–15)
BUN: 12 mg/dL (ref 8–23)
CO2: 23 mmol/L (ref 22–32)
Calcium: 8.9 mg/dL (ref 8.9–10.3)
Chloride: 97 mmol/L — ABNORMAL LOW (ref 98–111)
Creatinine, Ser: 0.82 mg/dL (ref 0.44–1.00)
GFR, Estimated: >60 mL/min (ref >60)
Glucose, Bld: 112 mg/dL — ABNORMAL HIGH (ref 70–99)
Potassium: 3.5 mmol/L (ref 3.5–5.1)
Sodium: 131 mmol/L — ABNORMAL LOW (ref 135–145)

## 2020-07-14 LAB — TROPONIN I (HIGH SENSITIVITY)
Troponin I (High Sensitivity): 8 ng/L (ref <18)
Troponin I (High Sensitivity): 8 ng/L (ref <18)

## 2020-07-14 LAB — SARS CORONAVIRUS 2 (TAT 6-24 HRS): SARS Coronavirus 2: POSITIVE — AB

## 2020-07-14 MED ORDER — SODIUM CHLORIDE 0.9 % IV BOLUS
1000.0000 mL | Freq: Once | INTRAVENOUS | Status: AC
  Administered 2020-07-14: 1000 mL via INTRAVENOUS

## 2020-07-14 MED ORDER — ONDANSETRON HCL 4 MG PO TABS: 4.0000 mg | ORAL_TABLET | Freq: Three times a day (TID) | ORAL | 0 refills | Status: DC | PRN

## 2020-07-14 MED ORDER — ONDANSETRON HCL 4 MG/2ML IJ SOLN
4.0000 mg | Freq: Once | INTRAMUSCULAR | Status: AC
  Administered 2020-07-14: 4 mg via INTRAVENOUS
  Filled 2020-07-14: qty 2

## 2020-07-14 MED ORDER — KETOROLAC TROMETHAMINE 15 MG/ML IJ SOLN
15.0000 mg | Freq: Once | INTRAMUSCULAR | Status: AC
  Administered 2020-07-14: 15 mg via INTRAVENOUS
  Filled 2020-07-14: qty 1

## 2020-07-14 NOTE — ED Provider Notes (Signed)
[facility] EMERGENCY DEPARTMENT Provider Note   CSN: [id number] Arrival date & time: 07/14/20  1003     History Chief Complaint  Patient presents with  . Dizziness    Mary Bradshaw is a 70 y.o. female presenting to ED with lightheadedness.  She reports onset of symptoms yesterday.  She describes a throbbing frontal headache.  She describes feeling lightheaded, worse with getting up.  She also describes congestion and a dry cough.  She says she has several grandchildren that have been sick with URI type symptoms over the past week.  She reports nausea and poor appetite.  She denies vomiting or diarrhea.  She denies chest pain or palpitations.  She denies vertigo.  She denies numbness or weakness of the extremities.  She denies any history of MI, cardiac arrhythmia, stroke or TIA.  She denies smoking history.  She reports she got 2 doses of COVID-vaccine several months ago but has not yet had a booster.  HPI     Past Medical History:  Diagnosis Date  . Headache   . Hypertension     There are no problems to display for this patient.   Past Surgical History:  Procedure Laterality Date  . ABDOMINAL HYSTERECTOMY    . APPENDECTOMY    . BREAST LUMPECTOMY     left side     OB History    Gravida  2   Para  2   Term  2   Preterm      AB      Living  2     SAB      IAB      Ectopic      Multiple      Live Births              No family history on file.  Social History   Tobacco Use  . Smoking status: Current Every Day Smoker    Packs/day: 1.00    Types: Cigarettes  . Smokeless tobacco: Never Used  Vaping Use  . Vaping Use: Never used  Substance Use Topics  . Alcohol use: No  . Drug use: No    Home Medications Prior to Admission medications   Medication Sig Start Date End Date Taking? Authorizing Provider  ondansetron (ZOFRAN) 4 MG tablet Take 1 tablet (4 mg total) by mouth every 8 (eight) hours as needed for up to 15 doses for  nausea or vomiting. 07/14/20  Yes Seaver Machia, Kermit Balo, MD  albuterol (PROVENTIL HFA;VENTOLIN HFA) 108 (90 Base) MCG/ACT inhaler Inhale 1-2 puffs into the lungs every 6 (six) hours as needed for wheezing. 07/13/17   Rolland Porter, MD  lisinopril-hydrochlorothiazide (PRINZIDE,ZESTORETIC) 20-12.5 MG per tablet Take 1 tablet by mouth daily.    [provider]    Allergies    Patient has no known allergies.  Review of Systems   Review of Systems  Constitutional: Positive for appetite change and fatigue. Negative for fever.  HENT: Negative for ear pain and sore throat.   Eyes: Negative for pain and visual disturbance.  Respiratory: Positive for shortness of breath. Negative for cough.   Cardiovascular: Negative for chest pain and palpitations.  Gastrointestinal: Positive for nausea. Negative for abdominal pain and vomiting.  Genitourinary: Negative for dysuria and hematuria.  Musculoskeletal: Negative for arthralgias and myalgias.  Skin: Negative for color change and rash.  Neurological: Positive for light-headedness and headaches.  All other systems reviewed and are negative.   Physical Exam Updated Vital Signs  BP 133/86 (BP Location: Right Arm)   Pulse 78   Temp 98.8 F (37.1 C) (Oral)   Resp 18   Ht 5' (1.524 m)   Wt 42.6 kg   LMP 10/25/2014 Comment: hysterectomy  SpO2 95%   BMI 18.36 kg/m   Physical Exam Constitutional:      General: She is not in acute distress. HENT:     Head: Normocephalic and atraumatic.  Eyes:     Conjunctiva/sclera: Conjunctivae normal.     Pupils: Pupils are equal, round, and reactive to light.  Cardiovascular:     Rate and Rhythm: Normal rate and regular rhythm.     Pulses: Normal pulses.  Pulmonary:     Effort: Pulmonary effort is normal. No respiratory distress.  Abdominal:     General: There is no distension.     Tenderness: There is no abdominal tenderness.  Skin:    General: Skin is warm and dry.  Neurological:     General: No  focal deficit present.     Mental Status: She is alert. Mental status is at baseline.  Psychiatric:        Mood and Affect: Mood normal.        Behavior: Behavior normal.     ED Results / Procedures / Treatments   Labs (all labs ordered are listed, but only abnormal results are displayed) Labs Reviewed  BASIC METABOLIC PANEL - Abnormal; Notable for the following components:      Result Value   Sodium 131 (*)    Chloride 97 (*)    Glucose, Bld 112 (*)    All other components within normal limits  CBC WITH DIFFERENTIAL/PLATELET - Abnormal; Notable for the following components:   WBC 3.1 (*)    Neutro Abs 1.6 (*)    All other components within normal limits  SARS CORONAVIRUS 2 (TAT 6-24 HRS)  TROPONIN I (HIGH SENSITIVITY)  TROPONIN I (HIGH SENSITIVITY)    EKG EKG Interpretation  Date/Time:  Wednesday July 14 2020 10:39:22 EST Ventricular Rate:  79 PR Interval:    QRS Duration: 96 QT Interval:  394 QTC Calculation: 452 R Axis:   -48 Text Interpretation: Sinus rhythm LAD, consider left anterior fascicular block Abnormal R-wave progression, late transition Left ventricular hypertrophy No significant change from prior ecg No STEMI Confirmed by Alvester Chou [phone]) on 07/14/2020 11:53:30 AM   Radiology DG Chest Portable 1 View  Result Date: 07/14/2020 CLINICAL DATA:  Onset dizziness this morning. EXAM: PORTABLE CHEST 1 VIEW COMPARISON:  PA and lateral chest 04/08/2020. FINDINGS: The lungs clear. Heart size normal. Aortic atherosclerosis. No pneumothorax or pleural fluid. Scoliosis noted. IMPRESSION: No acute disease. Aortic Atherosclerosis (ICD10-I70.0). Electronically Signed   By: Drusilla Kanner M.D.   On: 07/14/2020 12:25    Procedures Procedures (including critical care time)  Medications Ordered in ED Medications  sodium chloride 0.9 % bolus 1,000 mL (0 mLs Intravenous Stopped 07/14/20 1341)  ketorolac (TORADOL) 15 MG/ML injection 15 mg (15 mg Intravenous Given  07/14/20 1228)  ondansetron (ZOFRAN) injection 4 mg (4 mg Intravenous Given 07/14/20 1228)    ED Course  I have reviewed the triage vital signs and the nursing notes.  Pertinent labs & imaging results that were available during my care of the patient were reviewed by me and considered in my medical decision making (see chart for details).  70 yo female here with possible viral syndrome Malaise, lightheaded, coughing   Possible covid exposure at home.  We'll test here.  With her age I felt checking for atypical ACS and dehydration and anemia was reaosnable.  Labs reviewed.  Hgb normal 14.2  Trop of 8 with unremarkable ECG is less likely ACS.  BMP with some mild hyponatremia, Cr normal.   DG chest ordered and reviewed with no focal findings  Patient given IV fluids for hyponatremia, IV toradol for headache, IV zofran for nausea  ECG personally reviewed with no acute ischemic findings per my interpretation  On reassessment vitals were stable.  Discussed symptomatic mgmt at home and f/u for viral results.  Nisa Decaire was evaluated in Emergency Department on 07/14/2020 for the symptoms described in the history of present illness. She was evaluated in the context of the global COVID-19 pandemic, which necessitated consideration that the patient might be at risk for infection with the SARS-CoV-2 virus that causes COVID-19. Institutional protocols and algorithms that pertain to the evaluation of patients at risk for COVID-19 are in a state of rapid change based on information released by regulatory bodies including the CDC and federal and state organizations. These policies and algorithms were followed during the patient's care in the ED.   Clinical Course as of 07/14/20 1717  Wed Jul 14, 2020  1527 Patient is still feeling fatigued but is hungry now.  COVID test still pending.  I have a low suspicion for ACS at this point.  Her leukopenia suggestive of COVID.  I think is reasonable discharge  her home at this time and advised quarantine and checking up on her results. [MT]    Clinical Course User Index [MT] Jaeleah Smyser, Kermit Balo, MD    Final Clinical Impression(s) / ED Diagnoses Final diagnoses:  Weakness  Lightheadedness    Rx / DC Orders ED Discharge Orders         Ordered    ondansetron (ZOFRAN) 4 MG tablet  Every 8 hours PRN        07/14/20 1529           Terald Sleeper, MD 07/15/20 [phone]

## 2020-07-14 NOTE — ED Notes (Signed)
Pt on monitor and vitals cycling 

## 2020-07-14 NOTE — ED Triage Notes (Signed)
Reports feeling dizzy since waking up at 0500 and feeling as if she is going to fall.  Grips equal.

## 2020-07-14 NOTE — Discharge Instructions (Signed)
Your COVID test is still pending.  The results should be available by the end of the day or by early tomorrow.  Please check online for your results.  If you are positive, you should quarantine at home for a full 10 days from the onset of your symptoms.  Make sure you keep drinking plenty of water, and try to eat some saltines or soup and a little bit of food every day.  The rest of your blood tests were reassuring today.  Specifically, we did not see signs of a heart attack, anemia, or other serious infection.  Your oxygen level and vital signs looked good in the ER today.    If you feel like you are getting significantly weaker, or not able to keep down any food or water, or having difficulty managing your symptoms at home, you should return to the emergency department.

## 2020-09-28 ENCOUNTER — Other Ambulatory Visit: Payer: Self-pay

## 2020-09-28 ENCOUNTER — Emergency Department (HOSPITAL_BASED_OUTPATIENT_CLINIC_OR_DEPARTMENT_OTHER): Payer: Medicare HMO

## 2020-09-28 ENCOUNTER — Encounter (HOSPITAL_BASED_OUTPATIENT_CLINIC_OR_DEPARTMENT_OTHER): Payer: Self-pay

## 2020-09-28 ENCOUNTER — Emergency Department (HOSPITAL_BASED_OUTPATIENT_CLINIC_OR_DEPARTMENT_OTHER)
Admission: EM | Admit: 2020-09-28 | Discharge: 2020-09-28 | Disposition: A | Discharge status: Home / Self Care | Payer: Medicare HMO | Attending: Emergency Medicine | Admitting: Emergency Medicine

## 2020-09-28 DIAGNOSIS — J181 Lobar pneumonia, unspecified organism: Secondary | ICD-10-CM | POA: Insufficient documentation

## 2020-09-28 DIAGNOSIS — I10 Essential (primary) hypertension: Secondary | ICD-10-CM | POA: Insufficient documentation

## 2020-09-28 DIAGNOSIS — Z79899 Other long term (current) drug therapy: Secondary | ICD-10-CM | POA: Diagnosis not present

## 2020-09-28 DIAGNOSIS — Z20822 Contact with and (suspected) exposure to covid-19: Secondary | ICD-10-CM | POA: Diagnosis not present

## 2020-09-28 DIAGNOSIS — Z8616 Personal history of COVID-19: Secondary | ICD-10-CM | POA: Insufficient documentation

## 2020-09-28 DIAGNOSIS — J189 Pneumonia, unspecified organism: Secondary | ICD-10-CM

## 2020-09-28 DIAGNOSIS — R059 Cough, unspecified: Secondary | ICD-10-CM | POA: Diagnosis present

## 2020-09-28 DIAGNOSIS — F17210 Nicotine dependence, cigarettes, uncomplicated: Secondary | ICD-10-CM | POA: Diagnosis not present

## 2020-09-28 DIAGNOSIS — R519 Headache, unspecified: Secondary | ICD-10-CM | POA: Insufficient documentation

## 2020-09-28 HISTORY — DX: Calculus of kidney: N20.0

## 2020-09-28 LAB — SARS CORONAVIRUS 2 (TAT 6-24 HRS): SARS Coronavirus 2: NEGATIVE

## 2020-09-28 MED ORDER — LIDOCAINE HCL (PF) 1 % IJ SOLN
1.0000 mL | Freq: Once | INTRAMUSCULAR | Status: DC
  Filled 2020-09-28: qty 5

## 2020-09-28 MED ORDER — DOXYCYCLINE HYCLATE 100 MG PO CAPS: 100.0000 mg | ORAL_CAPSULE | Freq: Two times a day (BID) | ORAL | 0 refills | Status: AC

## 2020-09-28 MED ORDER — IPRATROPIUM BROMIDE HFA 17 MCG/ACT IN AERS
2.0000 | INHALATION_SPRAY | Freq: Once | RESPIRATORY_TRACT | Status: AC
  Administered 2020-09-28: 2 via RESPIRATORY_TRACT
  Filled 2020-09-28: qty 12.9

## 2020-09-28 MED ORDER — CEFTRIAXONE SODIUM 1 G IJ SOLR
1.0000 g | Freq: Once | INTRAMUSCULAR | Status: AC
  Administered 2020-09-28: 1 g via INTRAMUSCULAR
  Filled 2020-09-28: qty 10

## 2020-09-28 MED ORDER — LIDOCAINE HCL (PF) 1 % IJ SOLN
2.0000 mL | Freq: Once | INTRAMUSCULAR | Status: AC
  Administered 2020-09-28: 2 mL

## 2020-09-28 MED ORDER — ALBUTEROL SULFATE HFA 108 (90 BASE) MCG/ACT IN AERS
2.0000 | INHALATION_SPRAY | Freq: Once | RESPIRATORY_TRACT | Status: AC
  Administered 2020-09-28: 2 via RESPIRATORY_TRACT
  Filled 2020-09-28: qty 6.7

## 2020-09-28 NOTE — ED Triage Notes (Addendum)
Right flank pain x 1 week hx of kidney stones.  Right shoulder pain x "many months".   Patient coughing in triage, denies having COPD but is a lifelong smoker.  States the cough is "from smoking" but  has been worse the last two weeks with congestion and clear mucous.

## 2020-09-28 NOTE — ED Provider Notes (Signed)
[facility] EMERGENCY DEPARTMENT Provider Note   CSN: [id number] Arrival date & time: 09/28/20  1422     History Chief Complaint  Patient presents with  . Flank Pain  . Shoulder Pain  . Cough    Mary Bradshaw is a 70 y.o. female.  HPI   Patient with significant medical history of hypertension, kidney stones, bronchitis, current smoker presents with chief complaint of chest congestion.  She endorses that she has been having URI-like symptoms for the last week and a half, she endorses headaches, fevers, chills, nasal congestion, productive cough.  She denies general body aches, nausea, vomiting, diarrhea, denies any recent sick contacts, is not immunocompromise, is up-to-date on her Covid as well as her influenza.  Patient denies chest pain, shortness of breath, denies orthopnea, leg swelling, has no history of PEs or DVTs, currently not on hormone therapy, has no cardiac history.  She states that she is a current smoker, has frequent bronchitis exacerbations, never had been hospitalized for.  Patient denies any alleviating factors.  Patient denies chest pain, shortness of breath, abdominal pain, nausea, vomiting, diarrhea, urinary symptoms, worsening pedal edema.  Past Medical History:  Diagnosis Date  . Headache   . Hypertension   . Kidney stones     There are no problems to display for this patient.   Past Surgical History:  Procedure Laterality Date  . ABDOMINAL HYSTERECTOMY    . APPENDECTOMY    . BREAST LUMPECTOMY     left side     OB History    Gravida  2   Para  2   Term  2   Preterm      AB      Living  2     SAB      IAB      Ectopic      Multiple      Live Births              History reviewed. No pertinent family history.  Social History   Tobacco Use  . Smoking status: Current Every Day Smoker    Packs/day: 1.00    Types: Cigarettes  . Smokeless tobacco: Never Used  Vaping Use  . Vaping Use: Never used  Substance Use  Topics  . Alcohol use: No  . Drug use: No    Home Medications Prior to Admission medications   Medication Sig Start Date End Date Taking? Authorizing Provider  doxycycline (VIBRAMYCIN) 100 MG capsule Take 1 capsule (100 mg total) by mouth 2 (two) times daily for 7 days. 09/28/20 10/05/20 Yes Carroll Sage, PA-C  albuterol (PROVENTIL HFA;VENTOLIN HFA) 108 [phone] Base) MCG/ACT inhaler Inhale 1-2 puffs into the lungs every 6 (six) hours as needed for wheezing. 07/13/17   Rolland Porter, MD  lisinopril-hydrochlorothiazide (PRINZIDE,ZESTORETIC) 20-12.5 MG per tablet Take 1 tablet by mouth daily.    [provider]  ondansetron (ZOFRAN) 4 MG tablet Take 1 tablet (4 mg total) by mouth every 8 (eight) hours as needed for up to 15 doses for nausea or vomiting. 07/14/20   Trifan, Kermit Balo, MD    Allergies    Patient has no known allergies.  Review of Systems   Review of Systems  Constitutional: Positive for chills and fever.  HENT: Positive for congestion. Negative for sore throat.   Eyes: Negative for visual disturbance.  Respiratory: Positive for cough. Negative for shortness of breath.   Cardiovascular: Negative for chest pain.  Gastrointestinal: Negative for abdominal  pain, diarrhea, nausea and vomiting.  Genitourinary: Negative for enuresis.  Musculoskeletal: Negative for back pain.  Skin: Negative for rash.  Neurological: Positive for headaches. Negative for dizziness.  Hematological: Does not bruise/bleed easily.    Physical Exam Updated Vital Signs BP (!) 160/83   Pulse 92   Temp 99.8 F (37.7 C) (Oral)   Resp 20   Ht 5' (1.524 m)   Wt 42.2 kg   LMP 10/25/2014 Comment: hysterectomy  SpO2 94%   BMI 18.16 kg/m   Physical Exam Vitals and nursing note reviewed.  Constitutional:      General: She is not in acute distress.    Appearance: She is not ill-appearing.  HENT:     Head: Normocephalic and atraumatic.     Right Ear: Tympanic membrane, ear canal and external  ear normal.     Left Ear: Tympanic membrane, ear canal and external ear normal.     Nose: Congestion present.     Comments: Patient bilateral erythematous turbinates    Mouth/Throat:     Mouth: Mucous membranes are moist.     Pharynx: Oropharynx is clear. No oropharyngeal exudate or posterior oropharyngeal erythema.  Eyes:     Conjunctiva/sclera: Conjunctivae normal.  Cardiovascular:     Rate and Rhythm: Normal rate and regular rhythm.     Pulses: Normal pulses.     Heart sounds: No murmur heard. No friction rub. No gallop.   Pulmonary:     Effort: No respiratory distress.     Breath sounds: No wheezing, rhonchi or rales.     Comments: Patient had tight sounding chest, intermittent bibasilar Rales, no rhonchi or stridor present.   Chest:     Chest wall: Tenderness present.  Abdominal:     Palpations: Abdomen is soft.     Tenderness: There is no abdominal tenderness.  Musculoskeletal:     Right lower leg: No edema.     Left lower leg: No edema.  Skin:    General: Skin is warm and dry.  Neurological:     Mental Status: She is alert.  Psychiatric:        Mood and Affect: Mood normal.     ED Results / Procedures / Treatments   Labs (all labs ordered are listed, but only abnormal results are displayed) Labs Reviewed  SARS CORONAVIRUS 2 (TAT 6-24 HRS)    EKG EKG Interpretation  Date/Time:  Tuesday September 28 2020 15:23:28 EDT Ventricular Rate:  90 PR Interval:  146 QRS Duration: 91 QT Interval:  353 QTC Calculation: 432 R Axis:   -52 Text Interpretation: Sinus rhythm LAD, consider left anterior fascicular block Abnormal R-wave progression, early transition Left ventricular hypertrophy ST elevation, consider anterior injury When compared to prior, similar appearance. No STEMI Confirmed by Theda Belfast ([id number]) on 09/28/2020 4:28:38 PM   Radiology DG Chest Port 1 View  Result Date: 09/28/2020 CLINICAL DATA:  RIGHT flank pain for 1 week, history of kidney stones, RIGHT  shoulder pain for many months coughing in triage, COPD, lifelong smoker EXAM: PORTABLE CHEST 1 VIEW COMPARISON:  Portable exam 1524 hours compared to 07/14/2020 FINDINGS: Normal heart size, mediastinal contours, and pulmonary vascularity. Atherosclerotic calcification aorta. RIGHT lower lobe infiltrate consistent with pneumonia. Mild peribronchial thickening. Remaining lungs clear. No pleural effusion or pneumothorax. Biconvex thoracolumbar scoliosis. IMPRESSION: Bronchitic changes with RIGHT basilar infiltrate consistent with pneumonia. Electronically Signed   By: Ulyses Southward M.D.   On: 09/28/2020 15:46    Procedures Procedures   Medications  Ordered in ED Medications  albuterol (VENTOLIN HFA) 108 (90 Base) MCG/ACT inhaler 2 puff (2 puffs Inhalation Given 09/28/20 1523)  ipratropium (ATROVENT HFA) inhaler 2 puff (2 puffs Inhalation Given 09/28/20 1523)  cefTRIAXone (ROCEPHIN) injection 1 g (1 g Intramuscular Given 09/28/20 1626)  lidocaine (PF) (XYLOCAINE) 1 % injection 2 mL (2 mLs Other Given 09/28/20 1625)    ED Course  I have reviewed the triage vital signs and the nursing notes.  Pertinent labs & imaging results that were available during my care of the patient were reviewed by me and considered in my medical decision making (see chart for details).    MDM Rules/Calculators/A&P                         Initial impression-patient presents with URI-like symptoms.  She is alert, does not appear in acute distress, vital signs reassuring.  Will obtain chest x-ray, Covid test, provide patient with a butyryl and Atrovent, ambulate patient reassessed.  Work-up-chest x-ray shows bronchiolitic changes with right basilar infiltrate consistent with pneumonia.  Patient was ambulated did not become hypoxic, O2 sats stayed in the low 90s.  She did complain of shortness of breath.  Reassessment updated patient on imaging, informed that she has pneumonia, will provide her with a shot of Rocephin and provided  with doxycycline and have her follow-up with PCP in 1 week's time.  Patient agreeable to this plan.  Rule out- Low suspicion for systemic infection as patient is nontoxic-appearing, vital signs reassuring, no obvious source infection noted on exam.  I have low suspicion for PE as patient denies pleuritic chest pain, shortness of breath, patient is low risk factors, vital signs reassuring. low suspicion for strep throat as oropharynx was visualized, no erythema or exudates noted.  Low suspicion patient would need  hospitalized due to viral infection or Covid as vital signs reassuring, patient is not in respiratory distress.    Plan-patient has CAP, will start her on antibiotics, have her follow-up with PCP in 1 week's time for repeat chest x-ray.  Vital signs have remained stable, no indication for hospital admission.  Patient discussed with attending and they agreed with assessment and plan.  Patient given at home care as well strict return precautions.  Patient verbalized that they understood agreed to said plan.   Final Clinical Impression(s) / ED Diagnoses Final diagnoses:  Community acquired pneumonia of right lower lobe of lung    Rx / DC Orders ED Discharge Orders         Ordered    doxycycline (VIBRAMYCIN) 100 MG capsule  2 times daily        09/28/20 1635           Barnie Del 09/28/20 1639    Tegeler, Canary Brim, MD 09/28/20 1728

## 2020-09-28 NOTE — Discharge Instructions (Addendum)
You have a right lower lobe pneumonia.  I started you on antibiotics please take as prescribed.   also given you an inhaler please use as needed for shortness of breath.  You may use every 4-6 hours please beware it can increase your heart rate.  I need you to follow-up with your PCP in 1 week's time for repeat chest x-ray  Come back to the emergency department if you develop chest pain, shortness of breath, severe abdominal pain, uncontrolled nausea, vomiting, diarrhea.

## 2022-01-05 ENCOUNTER — Emergency Department (HOSPITAL_BASED_OUTPATIENT_CLINIC_OR_DEPARTMENT_OTHER)
Admission: EM | Admit: 2022-01-05 | Discharge: 2022-01-05 | Disposition: A | Discharge status: Home / Self Care | Payer: Medicare (Managed Care) | Attending: Emergency Medicine | Admitting: Emergency Medicine

## 2022-01-05 ENCOUNTER — Emergency Department (HOSPITAL_BASED_OUTPATIENT_CLINIC_OR_DEPARTMENT_OTHER): Payer: Medicare (Managed Care)

## 2022-01-05 ENCOUNTER — Other Ambulatory Visit: Payer: Self-pay

## 2022-01-05 ENCOUNTER — Encounter (HOSPITAL_BASED_OUTPATIENT_CLINIC_OR_DEPARTMENT_OTHER): Payer: Self-pay | Admitting: Emergency Medicine

## 2022-01-05 DIAGNOSIS — F17200 Nicotine dependence, unspecified, uncomplicated: Secondary | ICD-10-CM | POA: Insufficient documentation

## 2022-01-05 DIAGNOSIS — J441 Chronic obstructive pulmonary disease with (acute) exacerbation: Secondary | ICD-10-CM | POA: Diagnosis not present

## 2022-01-05 DIAGNOSIS — I10 Essential (primary) hypertension: Secondary | ICD-10-CM | POA: Insufficient documentation

## 2022-01-05 DIAGNOSIS — R059 Cough, unspecified: Secondary | ICD-10-CM | POA: Diagnosis present

## 2022-01-05 DIAGNOSIS — Z79899 Other long term (current) drug therapy: Secondary | ICD-10-CM | POA: Insufficient documentation

## 2022-01-05 DIAGNOSIS — Z20822 Contact with and (suspected) exposure to covid-19: Secondary | ICD-10-CM | POA: Diagnosis not present

## 2022-01-05 LAB — CBC WITH DIFFERENTIAL/PLATELET
Abs Immature Granulocytes: 0.04 10*3/uL (ref 0.00–0.07)
Basophils Absolute: 0 10*3/uL (ref 0.0–0.1)
Basophils Relative: 0 %
Eosinophils Absolute: 0 10*3/uL (ref 0.0–0.5)
Eosinophils Relative: 0 %
HCT: 43.3 % (ref 36.0–46.0)
Hemoglobin: 14.4 g/dL (ref 12.0–15.0)
Immature Granulocytes: 0 %
Lymphocytes Relative: 20 %
Lymphs Abs: 2.1 10*3/uL (ref 0.7–4.0)
MCH: 32.2 pg (ref 26.0–34.0)
MCHC: 33.3 g/dL (ref 30.0–36.0)
MCV: 96.9 fL (ref 80.0–100.0)
Monocytes Absolute: 1.1 10*3/uL — ABNORMAL HIGH (ref 0.1–1.0)
Monocytes Relative: 11 %
Neutro Abs: 7.4 10*3/uL (ref 1.7–7.7)
Neutrophils Relative %: 69 %
Platelets: 244 10*3/uL (ref 150–400)
RBC: 4.47 MIL/uL (ref 3.87–5.11)
RDW: 13.8 % (ref 11.5–15.5)
WBC: 10.7 10*3/uL — ABNORMAL HIGH (ref 4.0–10.5)
nRBC: 0 % (ref 0.0–0.2)

## 2022-01-05 LAB — BASIC METABOLIC PANEL
Anion gap: 9 (ref 5–15)
BUN: 12 mg/dL (ref 8–23)
CO2: 26 mmol/L (ref 22–32)
Calcium: 9.5 mg/dL (ref 8.9–10.3)
Chloride: 102 mmol/L (ref 98–111)
Creatinine, Ser: 0.79 mg/dL (ref 0.44–1.00)
GFR, Estimated: >60 mL/min (ref >60)
Glucose, Bld: 89 mg/dL (ref 70–99)
Potassium: 3.7 mmol/L (ref 3.5–5.1)
Sodium: 137 mmol/L (ref 135–145)

## 2022-01-05 LAB — SARS CORONAVIRUS 2 BY RT PCR: SARS Coronavirus 2 by RT PCR: NEGATIVE

## 2022-01-05 MED ORDER — LISINOPRIL-HYDROCHLOROTHIAZIDE 20-12.5 MG PO TABS: 1.0000 | ORAL_TABLET | Freq: Every day | ORAL | 0 refills | Status: DC

## 2022-01-05 MED ORDER — AZITHROMYCIN 250 MG PO TABS
500.0000 mg | ORAL_TABLET | Freq: Once | ORAL | Status: AC
  Administered 2022-01-05: 500 mg via ORAL
  Filled 2022-01-05: qty 2

## 2022-01-05 MED ORDER — HYDROCHLOROTHIAZIDE 25 MG PO TABS
12.5000 mg | ORAL_TABLET | Freq: Once | ORAL | Status: AC
  Administered 2022-01-05: 12.5 mg via ORAL
  Filled 2022-01-05: qty 1

## 2022-01-05 MED ORDER — PREDNISONE 20 MG PO TABS: 40.0000 mg | ORAL_TABLET | Freq: Every day | ORAL | 0 refills | Status: DC

## 2022-01-05 MED ORDER — LISINOPRIL 10 MG PO TABS
20.0000 mg | ORAL_TABLET | Freq: Once | ORAL | Status: AC
  Administered 2022-01-05: 20 mg via ORAL
  Filled 2022-01-05: qty 2

## 2022-01-05 MED ORDER — ALBUTEROL SULFATE HFA 108 (90 BASE) MCG/ACT IN AERS
2.0000 | INHALATION_SPRAY | RESPIRATORY_TRACT | Status: DC | PRN
Start: 2022-01-05 — End: 2022-01-05
  Administered 2022-01-05: 2 via RESPIRATORY_TRACT
  Filled 2022-01-05: qty 6.7

## 2022-01-05 MED ORDER — AZITHROMYCIN 250 MG PO TABS: 250.0000 mg | ORAL_TABLET | Freq: Every day | ORAL | 0 refills | Status: DC

## 2022-01-05 MED ORDER — PREDNISONE 20 MG PO TABS
40.0000 mg | ORAL_TABLET | Freq: Once | ORAL | Status: AC
  Administered 2022-01-05: 40 mg via ORAL
  Filled 2022-01-05: qty 2

## 2022-01-05 NOTE — ED Provider Notes (Signed)
[facility] EMERGENCY DEPARTMENT Provider Note   CSN: [id number] Arrival date & time: 01/05/22  1351     History  Chief Complaint  Patient presents with   Cough    Mary Bradshaw is a 71 y.o. female.  Patient with history of tobacco use, hypertension although has been out of medications for about a month presents to the emergency department today for evaluation of cough.  Patient reports productive cough over the past 1 week.  She has had chills and nausea but no fevers or vomiting.  Denies lower extremity swelling or pain.  No chest pain.  Short of breath with coughing.  No abdominal pain.  Patient cannot remember the name of her blood pressure medication.  She denies history of COPD (although chest x-ray suggests emphysema today).         Home Medications Prior to Admission medications   Medication Sig Start Date End Date Taking? Authorizing Provider  albuterol (PROVENTIL HFA;VENTOLIN HFA) 108 (90 Base) MCG/ACT inhaler Inhale 1-2 puffs into the lungs every 6 (six) hours as needed for wheezing. 07/13/17   Rolland Porter, MD  lisinopril-hydrochlorothiazide (PRINZIDE,ZESTORETIC) 20-12.5 MG per tablet Take 1 tablet by mouth daily.    [provider]  ondansetron (ZOFRAN) 4 MG tablet Take 1 tablet (4 mg total) by mouth every 8 (eight) hours as needed for up to 15 doses for nausea or vomiting. 07/14/20   Trifan, Kermit Balo, MD      Allergies    Patient has no known allergies.    Review of Systems   Review of Systems  Physical Exam Updated Vital Signs BP (!) 204/113 (BP Location: Left Arm)   Pulse (!) 101   Temp 99 F (37.2 C) (Oral)   Resp (!) 22 Comment: coughing  LMP 10/25/1995   SpO2 96%  Physical Exam Vitals and nursing note reviewed.  Constitutional:      Appearance: She is well-developed. She is not diaphoretic.  HENT:     Head: Normocephalic and atraumatic.     Right Ear: External ear normal.     Left Ear: External ear normal.     Mouth/Throat:      Mouth: Mucous membranes are moist. Mucous membranes are not dry.  Eyes:     Conjunctiva/sclera: Conjunctivae normal.  Neck:     Vascular: Normal carotid pulses. No JVD.     Trachea: Trachea normal. No tracheal deviation.  Cardiovascular:     Rate and Rhythm: Normal rate and regular rhythm.     Pulses: No decreased pulses.          Radial pulses are 2+ on the right side and 2+ on the left side.     Heart sounds: Normal heart sounds, S1 normal and S2 normal. No murmur heard. Pulmonary:     Effort: Pulmonary effort is normal. No respiratory distress.     Breath sounds: Wheezing and rhonchi present.     Comments: Frequent coughing during exam with scattered rhonchi and mild scattered wheezes Chest:     Chest wall: No tenderness.  Abdominal:     General: Bowel sounds are normal.     Palpations: Abdomen is soft.     Tenderness: There is no abdominal tenderness. There is no guarding or rebound.  Musculoskeletal:        General: Normal range of motion.     Cervical back: Normal range of motion and neck supple. No muscular tenderness.     Right lower leg: No edema.  Left lower leg: No edema.     Comments: No lower extremity swelling.  Skin:    General: Skin is warm and dry.     Coloration: Skin is not pale.  Neurological:     Mental Status: She is alert.     ED Results / Procedures / Treatments   Labs (all labs ordered are listed, but only abnormal results are displayed) Labs Reviewed  SARS CORONAVIRUS 2 BY RT PCR  CBC WITH DIFFERENTIAL/PLATELET  BASIC METABOLIC PANEL    EKG None  Radiology DG Chest 2 View  Result Date: 01/05/2022 CLINICAL DATA:  new productive cough.  Smoker EXAM: CHEST - 2 VIEW COMPARISON:  Chest x-ray 02/28/2021 FINDINGS: The heart and mediastinal contours are within normal limits. Hyperinflation of the lungs. Biapical pleural/pulmonary scarring. No focal consolidation. Chronic coarsened interstitial markings pulmonary edema. No pleural effusion. No  pneumothorax. No acute osseous abnormality. IMPRESSION: 1. No active cardiopulmonary disease. 2. Aortic Atherosclerosis (ICD10-I70.0) and Emphysema (ICD10-J43.9). Electronically Signed   By: Tish Frederickson M.D.   On: 01/05/2022 15:05    Procedures Procedures    Medications Ordered in ED Medications  albuterol (VENTOLIN HFA) 108 (90 Base) MCG/ACT inhaler 2 puff (2 puffs Inhalation Given 01/05/22 1637)  predniSONE (DELTASONE) tablet 40 mg (has no administration in time range)  azithromycin (ZITHROMAX) tablet 500 mg (has no administration in time range)  lisinopril (ZESTRIL) tablet 20 mg (20 mg Oral Given 01/05/22 1637)  hydrochlorothiazide (HYDRODIURIL) tablet 12.5 mg (12.5 mg Oral Given 01/05/22 1637)    ED Course/ Medical Decision Making/ A&P    Patient seen and examined. History obtained directly from patient.   Labs/EKG: Ordered CBC, BMP, EKG.  COVID testing negative.  Imaging: Chest x-ray personally reviewed and interpreted, agree no infiltrate.  No signs of edema.  Medications/Fluids: Patient will need to be restarted on blood pressure medication.  She was previously on HCTZ and lisinopril.  We will give a dose of this while lab work is running.  Most recent vital signs reviewed and are as follows: BP (!) 204/113 (BP Location: Left Arm)   Pulse (!) 101   Temp 99 F (37.2 C) (Oral)   Resp (!) 22 Comment: coughing  LMP 10/25/1995   SpO2 96%   Initial impression: Cough and wheezing concerning for COPD exacerbation, although patient denies having history of this.  Chest x-ray is concerning for emphysema.  We will give albuterol HFA.  Depending on labs and EKG, will consider steroid burst and possible antibiotic for COPD exacerbation.  5:08 PM Reassessment performed. Patient appears comfortable.  Coughing somewhat improved.  On reexam, lungs are more clear, minimal wheezing.  Labs personally reviewed and interpreted including: CBC with white blood cell count 10.7 otherwise  unremarkable; BMP with normal glucose and kidney function.  Reviewed pertinent lab work and imaging with patient at bedside. Questions answered.   Most current vital signs reviewed and are as follows: BP (!) 184/95   Pulse 100   Temp 99 F (37.2 C) (Oral)   Resp 20   LMP 10/25/1995   SpO2 98%   Plan: We will plan to discharge to home.  Patient will be given a dose of prednisone 40 mg here as well as azithromycin 500 mg and discharged home with an additional 4 days of each.  Prescriptions written for: We will also prescribe refill blood pressure medication.  Other home care instructions discussed: Albuterol inhaler with spacer every 4 hours  ED return instructions discussed: Return with fever, worsening  shortness of breath, increased work of breathing or trouble breathing, or other concerns.  Follow-up instructions discussed: Patient encouraged to follow-up with their PCP in 3 days.                            Medical Decision Making Amount and/or Complexity of Data Reviewed Labs: ordered. Radiology: ordered.  Risk Prescription drug management.   Patient with increased cough and shortness of breath with wheezing and rhonchi on exam.  Chest x-ray is not suggestive of CHF.  No infiltrate to suggest pneumonia.  Patient is a longtime smoker and likely has an element of COPD.  Patient be treated as COPD exacerbation today with treatment as above.  Low concern for ACS, PE, dissection.  No significant chest pain.  Tested negative for COVID here today.  HTN noted without obvious signs of endorgan damage.  Patient restarted on chronic medication.  She states that she has been out for a month.  The patient's vital signs, pertinent lab work and imaging were reviewed and interpreted as discussed in the ED course. Hospitalization was considered for further testing, treatments, or serial exams/observation. However as patient is well-appearing, has a stable exam, and reassuring studies today, I  do not feel that they warrant admission at this time. This plan was discussed with the patient who verbalizes agreement and comfort with this plan and seems reliable and able to return to the Emergency Department with worsening or changing symptoms.          Final Clinical Impression(s) / ED Diagnoses Final diagnoses:  COPD exacerbation (HCC)  Hypertension, unspecified type    Rx / DC Orders ED Discharge Orders          Ordered    predniSONE (DELTASONE) 20 MG tablet  Daily        01/05/22 1711    azithromycin (ZITHROMAX) 250 MG tablet  Daily        01/05/22 1711    lisinopril-hydrochlorothiazide (ZESTORETIC) 20-12.5 MG tablet  Daily        01/05/22 1712              Renne Crigler, PA-C 01/05/22 1713    Terrilee Files, MD 01/06/22 [phone]

## 2022-01-05 NOTE — ED Triage Notes (Signed)
Pt returned from [place] last Friday, now having chills, productive cough, head congestion. Taking OTC meds with some relief.

## 2022-01-05 NOTE — Discharge Instructions (Signed)
Please read and follow all provided instructions.  Your diagnoses today include:  1. COPD exacerbation (HCC)    Tests performed today include: Chest x-ray - did not show pneumonia Complete blood cell count: Minimally elevated white blood cell count, otherwise no problems Basic metabolic panel: Normal kidney function and blood sugar Vital signs. See below for your results today.   Medications prescribed:  Prednisone - steroid medicine   It is best to take this medication in the morning to prevent sleeping problems. If you are diabetic, monitor your blood sugar closely and stop taking Prednisone if blood sugar is over 300. Take with food to prevent stomach upset.   Azithromycin - antibiotic for respiratory infection  You have been prescribed an antibiotic medicine: take the entire course of medicine even if you are feeling better. Stopping early can cause the antibiotic not to work.  Albuterol inhaler - medication that opens up your airway  Use inhaler as follows: 1-2 puffs with spacer every 4 hours as needed for wheezing, cough, or shortness of breath.   Take any prescribed medications only as directed.  Home care instructions:  Follow any educational materials contained in this packet.  Follow-up instructions: Please follow-up with your primary care provider in the next 3 days for further evaluation of your symptoms and management of your COPD.   Return instructions:  Please return to the Emergency Department if you experience worsening symptoms. Please return with worsening wheezing, shortness of breath, or difficulty breathing. Return with persistent fever above 101F.  Please return if you have any other emergent concerns.  Additional Information:  Your vital signs today were: BP (!) 184/95   Pulse 100   Temp 99 F (37.2 C) (Oral)   Resp 20   LMP 10/25/1995   SpO2 98%  If your blood pressure (BP) was elevated above 135/85 this visit, please have this repeated by your  doctor within one month. --------------

## 2022-02-03 ENCOUNTER — Emergency Department (HOSPITAL_BASED_OUTPATIENT_CLINIC_OR_DEPARTMENT_OTHER): Payer: Medicare (Managed Care)

## 2022-02-03 ENCOUNTER — Other Ambulatory Visit: Payer: Self-pay

## 2022-02-03 ENCOUNTER — Encounter (HOSPITAL_BASED_OUTPATIENT_CLINIC_OR_DEPARTMENT_OTHER): Payer: Self-pay | Admitting: Emergency Medicine

## 2022-02-03 ENCOUNTER — Inpatient Hospital Stay (HOSPITAL_BASED_OUTPATIENT_CLINIC_OR_DEPARTMENT_OTHER)
Admission: EM | Admit: 2022-02-03 | Discharge: 2022-02-05 | DRG: 916 | Disposition: A | Discharge status: Home / Self Care | Payer: Medicare (Managed Care) | Attending: Pulmonary Disease | Admitting: Pulmonary Disease

## 2022-02-03 DIAGNOSIS — T783XXA Angioneurotic edema, initial encounter: Secondary | ICD-10-CM | POA: Diagnosis not present

## 2022-02-03 DIAGNOSIS — Z90710 Acquired absence of both cervix and uterus: Secondary | ICD-10-CM | POA: Diagnosis not present

## 2022-02-03 DIAGNOSIS — Z87442 Personal history of urinary calculi: Secondary | ICD-10-CM

## 2022-02-03 DIAGNOSIS — R000 Tachycardia, unspecified: Secondary | ICD-10-CM | POA: Diagnosis not present

## 2022-02-03 DIAGNOSIS — R229 Localized swelling, mass and lump, unspecified: Secondary | ICD-10-CM | POA: Diagnosis present

## 2022-02-03 DIAGNOSIS — Z888 Allergy status to other drugs, medicaments and biological substances status: Secondary | ICD-10-CM

## 2022-02-03 DIAGNOSIS — X58XXXA Exposure to other specified factors, initial encounter: Secondary | ICD-10-CM | POA: Diagnosis present

## 2022-02-03 DIAGNOSIS — Z79899 Other long term (current) drug therapy: Secondary | ICD-10-CM | POA: Diagnosis not present

## 2022-02-03 DIAGNOSIS — T464X5A Adverse effect of angiotensin-converting-enzyme inhibitors, initial encounter: Secondary | ICD-10-CM | POA: Diagnosis not present

## 2022-02-03 DIAGNOSIS — Z7952 Long term (current) use of systemic steroids: Secondary | ICD-10-CM | POA: Diagnosis not present

## 2022-02-03 DIAGNOSIS — F17210 Nicotine dependence, cigarettes, uncomplicated: Secondary | ICD-10-CM | POA: Diagnosis present

## 2022-02-03 DIAGNOSIS — Z20822 Contact with and (suspected) exposure to covid-19: Secondary | ICD-10-CM | POA: Diagnosis present

## 2022-02-03 DIAGNOSIS — J439 Emphysema, unspecified: Secondary | ICD-10-CM | POA: Diagnosis not present

## 2022-02-03 DIAGNOSIS — I10 Essential (primary) hypertension: Secondary | ICD-10-CM | POA: Diagnosis present

## 2022-02-03 LAB — CBC
HCT: 43.9 % (ref 36.0–46.0)
Hemoglobin: 14.7 g/dL (ref 12.0–15.0)
MCH: 32 pg (ref 26.0–34.0)
MCHC: 33.5 g/dL (ref 30.0–36.0)
MCV: 95.6 fL (ref 80.0–100.0)
Platelets: 285 10*3/uL (ref 150–400)
RBC: 4.59 MIL/uL (ref 3.87–5.11)
RDW: 14.3 % (ref 11.5–15.5)
WBC: 8.1 10*3/uL (ref 4.0–10.5)
nRBC: 0 % (ref 0.0–0.2)

## 2022-02-03 LAB — CBC WITH DIFFERENTIAL/PLATELET
Abs Immature Granulocytes: 0.01 10*3/uL (ref 0.00–0.07)
Basophils Absolute: 0 10*3/uL (ref 0.0–0.1)
Basophils Relative: 1 %
Eosinophils Absolute: 0 10*3/uL (ref 0.0–0.5)
Eosinophils Relative: 1 %
HCT: 43 % (ref 36.0–46.0)
Hemoglobin: 14.3 g/dL (ref 12.0–15.0)
Immature Granulocytes: 0 %
Lymphocytes Relative: 49 %
Lymphs Abs: 2.8 10*3/uL (ref 0.7–4.0)
MCH: 31.8 pg (ref 26.0–34.0)
MCHC: 33.3 g/dL (ref 30.0–36.0)
MCV: 95.6 fL (ref 80.0–100.0)
Monocytes Absolute: 0.5 10*3/uL (ref 0.1–1.0)
Monocytes Relative: 8 %
Neutro Abs: 2.3 10*3/uL (ref 1.7–7.7)
Neutrophils Relative %: 41 %
Platelets: 293 10*3/uL (ref 150–400)
RBC: 4.5 MIL/uL (ref 3.87–5.11)
RDW: 14.1 % (ref 11.5–15.5)
WBC: 5.6 10*3/uL (ref 4.0–10.5)
nRBC: 0 % (ref 0.0–0.2)

## 2022-02-03 LAB — BASIC METABOLIC PANEL
Anion gap: 5 (ref 5–15)
BUN: 22 mg/dL (ref 8–23)
CO2: 27 mmol/L (ref 22–32)
Calcium: 9.7 mg/dL (ref 8.9–10.3)
Chloride: 108 mmol/L (ref 98–111)
Creatinine, Ser: 1 mg/dL (ref 0.44–1.00)
GFR, Estimated: 59 mL/min — ABNORMAL LOW (ref >60)
Glucose, Bld: 96 mg/dL (ref 70–99)
Potassium: 3.9 mmol/L (ref 3.5–5.1)
Sodium: 140 mmol/L (ref 135–145)

## 2022-02-03 LAB — GLUCOSE, CAPILLARY: Glucose-Capillary: 125 mg/dL — ABNORMAL HIGH (ref 70–99)

## 2022-02-03 LAB — CREATININE, SERUM
Creatinine, Ser: 0.85 mg/dL (ref 0.44–1.00)
GFR, Estimated: >60 mL/min (ref >60)

## 2022-02-03 MED ORDER — DIPHENHYDRAMINE HCL 50 MG/ML IJ SOLN
50.0000 mg | Freq: Four times a day (QID) | INTRAMUSCULAR | Status: DC
  Administered 2022-02-04 (×2): 50 mg via INTRAVENOUS
  Filled 2022-02-03 (×2): qty 1

## 2022-02-03 MED ORDER — DEXAMETHASONE SODIUM PHOSPHATE 10 MG/ML IJ SOLN
10.0000 mg | Freq: Once | INTRAMUSCULAR | Status: AC
  Administered 2022-02-03: 10 mg via INTRAVENOUS
  Filled 2022-02-03: qty 1

## 2022-02-03 MED ORDER — ALBUTEROL SULFATE (2.5 MG/3ML) 0.083% IN NEBU: 2.5000 mg | INHALATION_SOLUTION | RESPIRATORY_TRACT | Status: DC | PRN

## 2022-02-03 MED ORDER — DIPHENHYDRAMINE HCL 50 MG/ML IJ SOLN
25.0000 mg | Freq: Once | INTRAMUSCULAR | Status: AC
  Administered 2022-02-03: 25 mg via INTRAVENOUS
  Filled 2022-02-03: qty 1

## 2022-02-03 MED ORDER — ORAL CARE MOUTH RINSE: 15.0000 mL | OROMUCOSAL | Status: DC | PRN

## 2022-02-03 MED ORDER — CHLORHEXIDINE GLUCONATE CLOTH 2 % EX PADS
6.0000 | MEDICATED_PAD | Freq: Every day | CUTANEOUS | Status: DC
  Administered 2022-02-03: 6 via TOPICAL

## 2022-02-03 MED ORDER — METHYLPREDNISOLONE SODIUM SUCC 40 MG IJ SOLR
40.0000 mg | Freq: Two times a day (BID) | INTRAMUSCULAR | Status: DC
  Administered 2022-02-04 (×2): 40 mg via INTRAVENOUS
  Filled 2022-02-03 (×2): qty 1

## 2022-02-03 MED ORDER — HEPARIN SODIUM (PORCINE) 5000 UNIT/ML IJ SOLN
5000.0000 [IU] | Freq: Three times a day (TID) | INTRAMUSCULAR | Status: DC
  Administered 2022-02-03 – 2022-02-05 (×5): 5000 [IU] via SUBCUTANEOUS
  Filled 2022-02-03 (×5): qty 1

## 2022-02-03 MED ORDER — METOPROLOL TARTRATE 5 MG/5ML IV SOLN
2.5000 mg | Freq: Four times a day (QID) | INTRAVENOUS | Status: DC | PRN
Start: 2022-02-03 — End: 2022-02-04
  Administered 2022-02-04: 2.5 mg via INTRAVENOUS
  Filled 2022-02-03: qty 5

## 2022-02-03 MED ORDER — FLUTICASONE PROPIONATE 50 MCG/ACT NA SUSP
1.0000 | Freq: Every day | NASAL | Status: DC
  Administered 2022-02-04 – 2022-02-05 (×3): 1 via NASAL
  Filled 2022-02-03 (×3): qty 16

## 2022-02-03 MED ORDER — FAMOTIDINE IN NACL 20-0.9 MG/50ML-% IV SOLN
20.0000 mg | Freq: Once | INTRAVENOUS | Status: AC
  Administered 2022-02-03: 20 mg via INTRAVENOUS
  Filled 2022-02-03: qty 50

## 2022-02-03 MED ORDER — SODIUM CHLORIDE 0.9 % IV SOLN: INTRAVENOUS | Status: DC

## 2022-02-03 MED ORDER — FAMOTIDINE IN NACL 20-0.9 MG/50ML-% IV SOLN
20.0000 mg | Freq: Two times a day (BID) | INTRAVENOUS | Status: DC
  Administered 2022-02-04: 20 mg via INTRAVENOUS
  Filled 2022-02-03: qty 50

## 2022-02-03 MED ORDER — HYDRALAZINE HCL 20 MG/ML IJ SOLN
20.0000 mg | Freq: Once | INTRAMUSCULAR | Status: AC
  Administered 2022-02-03: 20 mg via INTRAVENOUS
  Filled 2022-02-03: qty 1

## 2022-02-03 MED ORDER — SODIUM CHLORIDE 0.9 % IV SOLN
INTRAVENOUS | Status: DC | PRN
  Administered 2022-02-03: 10 mL/h via INTRAVENOUS

## 2022-02-03 NOTE — ED Notes (Signed)
RT assessed patient upon arrival to room. SAT 99% on RA. Stated she was not having any trouble breathing, but tongue swollen on left side. RT to assess as needed.

## 2022-02-03 NOTE — H&P (Signed)
NAME:  Mary Bradshaw, MRN:  [id number], DOB:  1950-09-17, LOS: 0 ADMISSION DATE:  02/03/2022, CONSULTATION DATE:  8/4 REFERRING MD:  Renaye Rakers, CHIEF COMPLAINT:  Angioedema   History of Present Illness:  Mary Bradshaw, is a 71 y.o. female, who presented to the [facility] with a chief complaint of tongue swelling.  They have a pertinent past medical history of HTN and active smoker.  Prior to arrival, patient stated that tongue started swelling unilaterally around 1pm on 8/4. Of note patient recently ran out of ACEi for about a month and resumed the medication around 7/6.  She was given benadryl, pepcid, and 10mg  decadron at [facility]. On room air, no resp distress, unilateral tongue swelling, no lip swelling, no hives.  PCCM was consulted for admission.  Pertinent  Medical History  HTN and active smoker.  Significant Hospital Events: Including procedures, antibiotic start and stop dates in addition to other pertinent events   8/4 presented PCCM admit  Interim History / Subjective:  See above  Subjective: denies chest pain, denies SOB, states "swelling is going down", and able to swallow.  Objective   Blood pressure (!) 146/86, pulse (!) 110, temperature 98.1 F (36.7 C), temperature source Oral, resp. rate (!) 28, height 5' (1.524 m), last menstrual period 10/25/1995, SpO2 100 %.       No intake or output data in the 24 hours ending 02/03/22 2136 There were no vitals filed for this visit.  Examination: General: In bed, NAD, appears comfortable HEENT: MM pink/moist, anicteric, atraumatic, left tongue swelling, no lip swelling Neuro: RASS 0, PERRL 28mm, GCS 15 CV: S1S2, ST, no m/r/g appreciated PULM:  clear in the upper lobes, clear in the lower lobes, trachea midline, chest expansion symmetric, no stridor, clear secretions GI: soft, bsx4 active, non-tender   Extremities: warm/dry, no pretibial edema, capillary refill less than 3 seconds  Skin:  no rashes or lesions noted     BMP  reviewed CBC reviewed CXR: no pneumo, effusion, or significant infiltrate EKG: NSR, no significant ST changes noted.  Resolved Hospital Problem list     Assessment & Plan:  Angioedema, suspect secondary to resumption of ACEi. Was out of medication and off for a period of time. Resumed 7/6. Unclear if this is cause as patient has been on medication for years. -Admit to ICU with spo2 and telemetry monitoring -Monitor for airway compromise -Start benadryl 50mg  q6h iv, pepcid 20 mg q14h iv, solumedrol 40mg  q12hr iv -NPO, ok for ice chips, nursing to do bedside swallow -prn albuterol  HTN -Stop ACEi -PRN iv hydralazine for SBP greater than 170  Active smoker -smoking cessation counseling when able  Congestion -start flownase  Best Practice (right click and "Reselect all SmartList Selections" daily)   Diet/type: NPO DVT prophylaxis: prophylactic heparin  GI prophylaxis: PPI Lines: N/A Foley:  N/A Code Status:  full code Last date of multidisciplinary goals of care discussion [full scope]  Labs   CBC: Recent Labs  Lab 02/03/22 1726  WBC 5.6  NEUTROABS 2.3  HGB 14.3  HCT 43.0  MCV 95.6  PLT 293    Basic Metabolic Panel: Recent Labs  Lab 02/03/22 1726  NA 140  K 3.9  CL 108  CO2 27  GLUCOSE 96  BUN 22  CREATININE 1.00  CALCIUM 9.7   GFR: CrCl cannot be calculated (Unknown ideal weight.). Recent Labs  Lab 02/03/22 1726  WBC 5.6    Liver Function Tests: No results for input(s): "AST", "ALT", "ALKPHOS", "BILITOT", "  PROT", "ALBUMIN" in the last 168 hours. No results for input(s): "LIPASE", "AMYLASE" in the last 168 hours. No results for input(s): "AMMONIA" in the last 168 hours.  ABG No results found for: "PHART", "PCO2ART", "PO2ART", "HCO3", "TCO2", "ACIDBASEDEF", "O2SAT"   Coagulation Profile: No results for input(s): "INR", "PROTIME" in the last 168 hours.  Cardiac Enzymes: No results for input(s): "CKTOTAL", "CKMB", "CKMBINDEX", "TROPONINI" in  the last 168 hours.  HbA1C: No results found for: "HGBA1C"  CBG: No results for input(s): "GLUCAP" in the last 168 hours.  Review of Systems:   Positives in bold  Gen: fever, chills, weight change, fatigue, night sweats HEENT:  blurred vision, double vision, hearing loss, tinnitus, sinus congestion, rhinorrhea, sore throat, neck stiffness, dysphagia PULM:  shortness of breath, cough, sputum production, hemoptysis, wheezing, tongue swelling CV: chest pain, edema, orthopnea, paroxysmal nocturnal dyspnea, palpitations GI:  abdominal pain, nausea, vomiting, diarrhea, hematochezia, melena, constipation, change in bowel habits GU: dysuria, hematuria, polyuria, oliguria, urethral discharge Endocrine: hot or cold intolerance, polyuria, polyphagia or appetite change Derm: rash, dry skin, scaling or peeling skin change Heme: easy bruising, bleeding, bleeding gums Neuro: headache, numbness, weakness, slurred speech, loss of memory or consciousness   Past Medical History:  She,  has a past medical history of Headache, Hypertension, and Kidney stones.   Surgical History:   Past Surgical History:  Procedure Laterality Date   ABDOMINAL HYSTERECTOMY     APPENDECTOMY     BREAST LUMPECTOMY     left side     Social History:   reports that she has been smoking cigarettes. She has been smoking an average of 1 pack per day. She has never used smokeless tobacco. She reports that she does not drink alcohol and does not use drugs.   Family History:  Her family history is not on file.   Allergies Allergies  Allergen Reactions   Lisinopril Anaphylaxis     Home Medications  Prior to Admission medications   Medication Sig Start Date End Date Taking? Authorizing Provider  albuterol (PROVENTIL HFA;VENTOLIN HFA) 108 (90 Base) MCG/ACT inhaler Inhale 1-2 puffs into the lungs every 6 (six) hours as needed for wheezing. 07/13/17   Rolland Porter, MD  azithromycin (ZITHROMAX) 250 MG tablet Take 1 tablet  (250 mg total) by mouth daily. 01/05/22   Renne Crigler, PA-C  lisinopril-hydrochlorothiazide (ZESTORETIC) 20-12.5 MG tablet Take 1 tablet by mouth daily. 01/05/22   Renne Crigler, PA-C  ondansetron (ZOFRAN) 4 MG tablet Take 1 tablet (4 mg total) by mouth every 8 (eight) hours as needed for up to 15 doses for nausea or vomiting. 07/14/20   Terald Sleeper, MD  predniSONE (DELTASONE) 20 MG tablet Take 2 tablets (40 mg total) by mouth daily. 01/05/22   Renne Crigler, PA-C     Critical care time: NA    Gershon Mussel., MSN, APRN, AGACNP-BC [facility]  02/03/2022 , 9:36 PM  Please see [email or web address] for pager details  If no response, please call [phone] After hours, please call Elink at [phone]

## 2022-02-03 NOTE — Progress Notes (Signed)
eLink Physician-Brief Progress Note Patient Name: Mary Bradshaw DOB: [birth year] MRN: [id number]   Date of Service  02/03/2022  HPI/Events of Note  New patient evaluation  61F who presented with difficulty swallowing and tongue swelling after starting lisinopril. S/p decadron, benadryl and pepcid with improvement.   Admitted to ICU for angioedema and airway monitoring  Tele with sinus tachycardia  eICU Interventions  Monitor respiratory status Continue benadryl, pepcid and solumedrol        Kanija Remmel Mechele Collin 02/03/2022, 10:42 PM

## 2022-02-03 NOTE — Progress Notes (Deleted)
NAME:  Mary Bradshaw, MRN:  [id number], DOB:  1951/01/16, LOS: 0 ADMISSION DATE:  02/03/2022, CONSULTATION DATE:  8/4 REFERRING MD:  Renaye Rakers, CHIEF COMPLAINT:  Angioedema   History of Present Illness:  Mary Bradshaw, is a 71 y.o. female, who presented to the [facility] with a chief complaint of tongue swelling.  They have a pertinent past medical history of HTN and active smoker.  Prior to arrival, patient stated that tongue started swelling unilaterally around 1pm on 8/4. Of note patient recently ran out of ACEi for about a month and resumed the medication around 7/6.  She was given benadryl, pepcid, and 10mg  decadron at [facility] On room air, no resp distress, unilateral tongue swelling, no lip swelling, no hives.  PCCM was consulted for admission.  Pertinent  Medical History  HTN and active smoker.  Significant Hospital Events: Including procedures, antibiotic start and stop dates in addition to other pertinent events   8/4 presented PCCM admit  Interim History / Subjective:  See above  Subjective: denies chest pain, denies SOB, states "swelling is going down", and able to swallow.  Objective   Blood pressure (!) 149/78, pulse (!) 115, temperature 99.2 F (37.3 C), temperature source Oral, resp. rate (!) 27, height 5' (1.524 m), last menstrual period 10/25/1995, SpO2 99 %.       No intake or output data in the 24 hours ending 02/03/22 2044 There were no vitals filed for this visit.  Examination: General: In bed, NAD, appears comfortable HEENT: MM pink/moist, anicteric, atraumatic, left tongue swelling, no lip swelling Neuro: RASS 0, PERRL 45mm, GCS 15 CV: S1S2, ST, no m/r/g appreciated PULM:  clear in the upper lobes, clear in the lower lobes, trachea midline, chest expansion symmetric, no stridor, clear secretions GI: soft, bsx4 active, non-tender   Extremities: warm/dry, no pretibial edema, capillary refill less than 3 seconds  Skin:  no rashes or lesions noted     BMP  reviewed CBC reviewed CXR: no pneumo, effusion, or significant infiltrate EKG: NSR, no significant ST changes noted.  Resolved Hospital Problem list     Assessment & Plan:  Angioedema, suspect secondary to resumption of ACEi. Was out of medication and off for a period of time. Resumed 7/6. Unclear if this is cause as patient has been on medication for years. -Admit to ICU with spo2 and telemetry monitoring -Monitor for airway compromise -Start benadryl 50mg  q6h iv, pepcid 20 mg q14h iv, solumedrol 40mg  q12hr iv -NPO, ok for ice chips, nursing to do bedside swallow -prn albuterol  HTN -Stop ACEi -PRN iv hydralazine for SBP greater than 170  Active smoker -smoking cessation counseling when able  Congestion -start flownase  Best Practice (right click and "Reselect all SmartList Selections" daily)   Diet/type: NPO DVT prophylaxis: prophylactic heparin  GI prophylaxis: PPI Lines: N/A Foley:  N/A Code Status:  full code Last date of multidisciplinary goals of care discussion [full scope]  Labs   CBC: Recent Labs  Lab 02/03/22 1726  WBC 5.6  NEUTROABS 2.3  HGB 14.3  HCT 43.0  MCV 95.6  PLT 293    Basic Metabolic Panel: Recent Labs  Lab 02/03/22 1726  NA 140  K 3.9  CL 108  CO2 27  GLUCOSE 96  BUN 22  CREATININE 1.00  CALCIUM 9.7   GFR: CrCl cannot be calculated (Unknown ideal weight.). Recent Labs  Lab 02/03/22 1726  WBC 5.6    Liver Function Tests: No results for input(s): "AST", "ALT", "ALKPHOS", "BILITOT", "  PROT", "ALBUMIN" in the last 168 hours. No results for input(s): "LIPASE", "AMYLASE" in the last 168 hours. No results for input(s): "AMMONIA" in the last 168 hours.  ABG No results found for: "PHART", "PCO2ART", "PO2ART", "HCO3", "TCO2", "ACIDBASEDEF", "O2SAT"   Coagulation Profile: No results for input(s): "INR", "PROTIME" in the last 168 hours.  Cardiac Enzymes: No results for input(s): "CKTOTAL", "CKMB", "CKMBINDEX", "TROPONINI" in  the last 168 hours.  HbA1C: No results found for: "HGBA1C"  CBG: No results for input(s): "GLUCAP" in the last 168 hours.  Review of Systems:   Positives in bold  Gen: fever, chills, weight change, fatigue, night sweats HEENT:  blurred vision, double vision, hearing loss, tinnitus, sinus congestion, rhinorrhea, sore throat, neck stiffness, dysphagia PULM:  shortness of breath, cough, sputum production, hemoptysis, wheezing, tongue swelling CV: chest pain, edema, orthopnea, paroxysmal nocturnal dyspnea, palpitations GI:  abdominal pain, nausea, vomiting, diarrhea, hematochezia, melena, constipation, change in bowel habits GU: dysuria, hematuria, polyuria, oliguria, urethral discharge Endocrine: hot or cold intolerance, polyuria, polyphagia or appetite change Derm: rash, dry skin, scaling or peeling skin change Heme: easy bruising, bleeding, bleeding gums Neuro: headache, numbness, weakness, slurred speech, loss of memory or consciousness   Past Medical History:  She,  has a past medical history of Headache, Hypertension, and Kidney stones.   Surgical History:   Past Surgical History:  Procedure Laterality Date   ABDOMINAL HYSTERECTOMY     APPENDECTOMY     BREAST LUMPECTOMY     left side     Social History:   reports that she has been smoking cigarettes. She has been smoking an average of 1 pack per day. She has never used smokeless tobacco. She reports that she does not drink alcohol and does not use drugs.   Family History:  Her family history is not on file.   Allergies Allergies  Allergen Reactions   Lisinopril Anaphylaxis     Home Medications  Prior to Admission medications   Medication Sig Start Date End Date Taking? Authorizing Provider  albuterol (PROVENTIL HFA;VENTOLIN HFA) 108 (90 Base) MCG/ACT inhaler Inhale 1-2 puffs into the lungs every 6 (six) hours as needed for wheezing. 07/13/17   Rolland Porter, MD  azithromycin (ZITHROMAX) 250 MG tablet Take 1 tablet  (250 mg total) by mouth daily. 01/05/22   Renne Crigler, PA-C  lisinopril-hydrochlorothiazide (ZESTORETIC) 20-12.5 MG tablet Take 1 tablet by mouth daily. 01/05/22   Renne Crigler, PA-C  ondansetron (ZOFRAN) 4 MG tablet Take 1 tablet (4 mg total) by mouth every 8 (eight) hours as needed for up to 15 doses for nausea or vomiting. 07/14/20   Terald Sleeper, MD  predniSONE (DELTASONE) 20 MG tablet Take 2 tablets (40 mg total) by mouth daily. 01/05/22   Renne Crigler, PA-C     Critical care time: NA    Gershon Mussel., MSN, APRN, AGACNP-BC [facility]  02/03/2022 , 9:34 PM  Please see [email or web address] for pager details  If no response, please call [phone] After hours, please call Elink at [phone]

## 2022-02-03 NOTE — ED Triage Notes (Signed)
States that her tongue started swelling about 1pm today. Swelling noted to left side of tongue. Taking lisinopril

## 2022-02-03 NOTE — ED Provider Notes (Signed)
[facility] EMERGENCY DEPARTMENT Provider Note   CSN: [id number] Arrival date & time: 02/03/22  1645     History  Chief Complaint  Patient presents with   Angioedema    Mary Bradshaw is a 71 y.o. female here with left tongue swelling that began around 1 PM spontaneously today.  She is on lisinopril chronically.  No prior history of angioedema or anaphylaxis.  No tightness of the throat.  HPI     Home Medications Prior to Admission medications   Medication Sig Start Date End Date Taking? Authorizing Provider  albuterol (PROVENTIL HFA;VENTOLIN HFA) 108 (90 Base) MCG/ACT inhaler Inhale 1-2 puffs into the lungs every 6 (six) hours as needed for wheezing. 07/13/17   Rolland Porter, MD  azithromycin (ZITHROMAX) 250 MG tablet Take 1 tablet (250 mg total) by mouth daily. 01/05/22   Renne Crigler, PA-C  lisinopril-hydrochlorothiazide (ZESTORETIC) 20-12.5 MG tablet Take 1 tablet by mouth daily. 01/05/22   Renne Crigler, PA-C  ondansetron (ZOFRAN) 4 MG tablet Take 1 tablet (4 mg total) by mouth every 8 (eight) hours as needed for up to 15 doses for nausea or vomiting. 07/14/20   Terald Sleeper, MD  predniSONE (DELTASONE) 20 MG tablet Take 2 tablets (40 mg total) by mouth daily. 01/05/22   Renne Crigler, PA-C      Allergies    Lisinopril    Review of Systems   Review of Systems  Physical Exam Updated Vital Signs BP (!) 149/78   Pulse (!) 115   Temp 99.2 F (37.3 C) (Oral)   Resp (!) 27   Ht 5' (1.524 m)   LMP 10/25/1995   SpO2 99%   BMI 18.16 kg/m  Physical Exam Constitutional:      General: She is not in acute distress. HENT:     Head: Normocephalic and atraumatic.  Eyes:     Conjunctiva/sclera: Conjunctivae normal.     Pupils: Pupils are equal, round, and reactive to light.  Cardiovascular:     Rate and Rhythm: Normal rate and regular rhythm.  Pulmonary:     Effort: Pulmonary effort is normal. No respiratory distress.     Comments: Left-sided tongue  swelling Oropharynx non-erythematous.  No tonsillar swelling or exudate.  No uvular deviation.  No drooling. No brawny edema. No stridor.   Abdominal:     General: There is no distension.     Tenderness: There is no abdominal tenderness.  Skin:    General: Skin is warm and dry.  Neurological:     General: No focal deficit present.     Mental Status: She is alert. Mental status is at baseline.  Psychiatric:        Mood and Affect: Mood normal.        Behavior: Behavior normal.     ED Results / Procedures / Treatments   Labs (all labs ordered are listed, but only abnormal results are displayed) Labs Reviewed  BASIC METABOLIC PANEL - Abnormal; Notable for the following components:      Result Value   GFR, Estimated 59 (*)    All other components within normal limits  CBC WITH DIFFERENTIAL/PLATELET    EKG EKG Interpretation  Date/Time:  Friday February 03 2022 17:40:07 EDT Ventricular Rate:  97 PR Interval:  151 QRS Duration: 94 QT Interval:  364 QTC Calculation: 463 R Axis:   -55 Text Interpretation: Sinus rhythm LAE, consider biatrial enlargement Left anterior fascicular block Abnormal R-wave progression, early transition Left ventricular hypertrophy No STEMI  Confirmed by Alvester Chou [phone]) on 02/03/2022 5:42:57 PM  Radiology DG Chest Portable 1 View  Result Date: 02/03/2022 CLINICAL DATA:  Shortness of breath, cough. Admission for angioedema. EXAM: PORTABLE CHEST 1 VIEW COMPARISON:  01/05/2022. FINDINGS: The heart size and mediastinal contours are stable. There is atherosclerotic calcification of the aorta. Minimal atelectasis or infiltrate is noted at the left lung base. No consolidation, effusion, or pneumothorax. Scoliosis is noted. No acute osseous abnormality. IMPRESSION: Minimal atelectasis or infiltrate at the left lung base. Electronically Signed   By: Thornell Sartorius M.D.   On: 02/03/2022 20:32    Procedures .Critical Care  Performed by: Terald Sleeper,  MD Authorized by: Terald Sleeper, MD   Critical care provider statement:    Critical care time (minutes):  45   Critical care time was exclusive of:  Separately billable procedures and treating other patients   Critical care was necessary to treat or prevent imminent or life-threatening deterioration of the following conditions:  Respiratory failure   Critical care was time spent personally by me on the following activities:  Ordering and performing treatments and interventions, ordering and review of laboratory studies, ordering and review of radiographic studies, pulse oximetry, review of old charts, examination of patient and evaluation of patient's response to treatment   Care discussed with: admitting provider   Comments:     Repeat airway evaluation for angioedema     Medications Ordered in ED Medications  0.9 %  sodium chloride infusion (10 mL/hr Intravenous New Bag/Given 02/03/22 1732)  diphenhydrAMINE (BENADRYL) injection 25 mg (25 mg Intravenous Given 02/03/22 1731)  famotidine (PEPCID) IVPB 20 mg premix (0 mg Intravenous Stopped 02/03/22 1811)  dexamethasone (DECADRON) injection 10 mg (10 mg Intravenous Given 02/03/22 1740)  hydrALAZINE (APRESOLINE) injection 20 mg (20 mg Intravenous Given 02/03/22 1838)    ED Course/ Medical Decision Making/ A&P Clinical Course as of 02/03/22 2041  Fri Feb 03, 2022  1737 71 yo female here with angioedema, onset 4 hours ago involving the left half of her tongue, no throat tightness, speaking clearly in full sentences, no stridor on exam.  No prior hx of angioedema.  She is on lisinopril, which may be the culprit agent.  Given steroids, benadryl here - will monitor closely. [MT]  1826 Patient's tongue swelling is unchanged on reassessment.  She is not hypoxic.  She is increasingly hypertensive, and we will give some IV blood pressure medications.  She continues to have persistent coughing and spitting her secretions into a bag.  She denies that her  throat feels tight, and says she is spitting because her tongue is swollen.  However she is able to speak very clearly to me.  Overall this is a mixed presentation is not clear to me whether there may be further airway involvement, and I recommended ED to ED transfer to [facility], if the patient were to need higher level of care.  The patient and her family are in agreement.  She'll be transported by EMS.  I've spoken to Dr Lockie Mola EDP and notified staff providers. [MT]  1940 BP improved, tongue swelling improved, uvula nonedematous, voice clear.  She is still spitting up.  Pending transfer [MT]  1959 Patient continues to have persistent coughing.  She reports she has been having coughing for about 2 months.  She was seen in the ER about a month ago for the same thing, persistent daily coughing.  Her x-rays were negative at that time.  I wonder  if this may be related to ACE inhibitor use. [MT]  2040 Pt gone by [facility].  I spoke to Dr Craige Cotta from critical care who will consult on patient upon her arrival in [facility] ED. [MT]    Clinical Course User Index [MT] Arlene Brickel, Kermit Balo, MD                           Medical Decision Making Amount and/or Complexity of Data Reviewed Radiology: ordered.  Risk Prescription drug management.   Patient is here with suspected angioedema, which may be related to lisinopril use.  She has been monitored closely.  No emergent indication for intubation on arrival, but her symptoms have been developing over the past 4 hours, she will need close monitoring.  IV medications given.        Final Clinical Impression(s) / ED Diagnoses Final diagnoses:  Angioedema, initial encounter    Rx / DC Orders ED Discharge Orders     None         Terald Sleeper, MD 02/03/22 2041

## 2022-02-03 NOTE — ED Notes (Signed)
Received report from [facility]. Pt here for observation for angioedema and tachycardia. Pt is alert and oriented. VSS w/ [facility], except tachycardia.

## 2022-02-03 NOTE — ED Notes (Signed)
ED Provider at bedside. 

## 2022-02-03 NOTE — ED Notes (Signed)
[facility] at bedside. Pt stable for transport. 

## 2022-02-03 NOTE — ED Provider Notes (Signed)
Patient transferred from med center with angioedema of the left side of the tongue.  Started spontaneously.  She is on lisinopril.  She has been transferred to be evaluated by pulmonary ICU team.  Dr. Craige Cotta came down to evaluate the patient and they will admit for observation.  She is stable at this time.  She has been having some spitting up episodes and ultimately she seems to be protecting her airway.  They will closely monitor her in the ICU.  This chart was dictated using voice recognition software.  Despite best efforts to proofread,  errors can occur which can change the documentation meaning.    Virgina Norfolk, DO 02/03/22 2118

## 2022-02-04 DIAGNOSIS — T783XXA Angioneurotic edema, initial encounter: Secondary | ICD-10-CM | POA: Diagnosis not present

## 2022-02-04 LAB — CBC
HCT: 46.3 % — ABNORMAL HIGH (ref 36.0–46.0)
Hemoglobin: 15.4 g/dL — ABNORMAL HIGH (ref 12.0–15.0)
MCH: 31.3 pg (ref 26.0–34.0)
MCHC: 33.3 g/dL (ref 30.0–36.0)
MCV: 94.1 fL (ref 80.0–100.0)
Platelets: 304 10*3/uL (ref 150–400)
RBC: 4.92 MIL/uL (ref 3.87–5.11)
RDW: 14.2 % (ref 11.5–15.5)
WBC: 6.3 10*3/uL (ref 4.0–10.5)
nRBC: 0 % (ref 0.0–0.2)

## 2022-02-04 LAB — BASIC METABOLIC PANEL
Anion gap: 10 (ref 5–15)
BUN: 15 mg/dL (ref 8–23)
CO2: 23 mmol/L (ref 22–32)
Calcium: 10.1 mg/dL (ref 8.9–10.3)
Chloride: 106 mmol/L (ref 98–111)
Creatinine, Ser: 0.91 mg/dL (ref 0.44–1.00)
GFR, Estimated: >60 mL/min (ref >60)
Glucose, Bld: 141 mg/dL — ABNORMAL HIGH (ref 70–99)
Potassium: 3.9 mmol/L (ref 3.5–5.1)
Sodium: 139 mmol/L (ref 135–145)

## 2022-02-04 LAB — GLUCOSE, CAPILLARY
Glucose-Capillary: 128 mg/dL — ABNORMAL HIGH (ref 70–99)
Glucose-Capillary: 112 mg/dL — ABNORMAL HIGH (ref 70–99)
Glucose-Capillary: 89 mg/dL (ref 70–99)
Glucose-Capillary: 168 mg/dL — ABNORMAL HIGH (ref 70–99)

## 2022-02-04 LAB — RESPIRATORY PANEL BY PCR

## 2022-02-04 LAB — MRSA NEXT GEN BY PCR, NASAL: MRSA by PCR Next Gen: NOT DETECTED

## 2022-02-04 LAB — MAGNESIUM: Magnesium: 1.9 mg/dL (ref 1.7–2.4)

## 2022-02-04 MED ORDER — HYDRALAZINE HCL 20 MG/ML IJ SOLN
10.0000 mg | Freq: Four times a day (QID) | INTRAMUSCULAR | Status: DC | PRN
Start: 2022-02-04 — End: 2022-02-05

## 2022-02-04 MED ORDER — DIPHENHYDRAMINE HCL 25 MG PO CAPS
25.0000 mg | ORAL_CAPSULE | Freq: Four times a day (QID) | ORAL | Status: AC
  Administered 2022-02-04 – 2022-02-05 (×4): 25 mg via ORAL
  Filled 2022-02-04 (×4): qty 1

## 2022-02-04 MED ORDER — FAMOTIDINE 20 MG PO TABS
20.0000 mg | ORAL_TABLET | Freq: Two times a day (BID) | ORAL | Status: DC
  Administered 2022-02-04 (×2): 20 mg via ORAL
  Filled 2022-02-04 (×2): qty 1

## 2022-02-04 MED ORDER — AMLODIPINE BESYLATE 10 MG PO TABS
10.0000 mg | ORAL_TABLET | Freq: Every day | ORAL | Status: DC
  Administered 2022-02-04 – 2022-02-05 (×2): 10 mg via ORAL
  Filled 2022-02-04 (×2): qty 1

## 2022-02-04 NOTE — Progress Notes (Signed)
NAME:  Mary Bradshaw, MRN:  [id number], DOB:  August 17, 1950, LOS: 1 ADMISSION DATE:  02/03/2022, CONSULTATION DATE:  8/4 REFERRING MD:  Renaye Rakers, CHIEF COMPLAINT:  Angioedema   History of Present Illness:  Mary Bradshaw, is a 71 y.o. female, who presented to the [facility] with a chief complaint of tongue swelling.  They have a pertinent past medical history of HTN and active smoker.  Prior to arrival, patient stated that tongue started swelling unilaterally around 1pm on 8/4. Of note patient recently ran out of ACEi for about a month and resumed the medication around 7/6.  She was given benadryl, pepcid, and 10mg  decadron at [facility]. On room air, no resp distress, unilateral tongue swelling, no lip swelling, no hives.  PCCM was consulted for admission.  Pertinent  Medical History  HTN and active smoker.  Significant Hospital Events: Including procedures, antibiotic start and stop dates in addition to other pertinent events   8/4 presented PCCM admit  Interim History / Subjective:  No events overnight. Patient states swelling has improved. Plan for bedside swallow evaluation   Objective   Blood pressure (!) 152/95, pulse 83, temperature 98.4 F (36.9 C), temperature source Oral, resp. rate (!) 22, height 5' (1.524 m), weight 41.2 kg, last menstrual period 10/25/1995, SpO2 97 %.        Intake/Output Summary (Last 24 hours) at 02/04/2022 0856 Last data filed at 02/04/2022 0800 Gross per 24 hour  Intake 508.44 ml  Output 0 ml  Net 508.44 ml   Filed Weights   02/03/22 2237 02/04/22 0425  Weight: 41.2 kg 41.2 kg    Examination: General: adult female, lying in bed, no distress noted  HEENT: MMM, no tongue swelling noted  Neuro: alert, oriented, follows commands  CV: RRR, no MRG PULM:  clear breath sounds, no stridor GI: soft, bsx4 active, non-tender   Extremities: - edema  Skin:  no rashes or lesions noted  Resolved Hospital Problem list     Assessment & Plan:   Angioedema, suspect  secondary to resumption of ACEi. Was out of medication and off for a period of time. Resumed 7/6. Unclear if this is cause as patient has been on medication for years. -Monitor for airway compromise -Continue benadryl 50mg  q6h iv, pepcid 20 mg q14h iv, solumedrol 40mg  q12hr iv -pending bedside swallow  -prn albuterol  HTN -if passes bedside swallow wills start PO  -PRN iv hydralazine for SBP greater than 170  Active smoker -smoking cessation counseling when able  Congestion -continue flonase -send RVP   If passes bedside swallow and remains stable will transfer to floor today.   Best Practice (right click and "Reselect all SmartList Selections" daily)   Diet/type: NPO DVT prophylaxis: prophylactic heparin  GI prophylaxis: PPI Code Status:  full code Last date of multidisciplinary goals of care discussion [full scope]  Labs   CBC: Recent Labs  Lab 02/03/22 1726 02/03/22 2149 02/04/22 0031  WBC 5.6 8.1 6.3  NEUTROABS 2.3  --   --   HGB 14.3 14.7 15.4*  HCT 43.0 43.9 46.3*  MCV 95.6 95.6 94.1  PLT 293 285 304    Basic Metabolic Panel: Recent Labs  Lab 02/03/22 1726 02/03/22 2149 02/04/22 0031  NA 140  --  139  K 3.9  --  3.9  CL 108  --  106  CO2 27  --  23  GLUCOSE 96  --  141*  BUN 22  --  15  CREATININE 1.00 0.85 0.91  CALCIUM 9.7  --  10.1  MG  --   --  1.9   GFR: Estimated Creatinine Clearance: 35.8 mL/min (by C-G formula based on SCr of 0.91 mg/dL). Recent Labs  Lab 02/03/22 1726 02/03/22 2149 02/04/22 0031  WBC 5.6 8.1 6.3    Liver Function Tests: No results for input(s): "AST", "ALT", "ALKPHOS", "BILITOT", "PROT", "ALBUMIN" in the last 168 hours. No results for input(s): "LIPASE", "AMYLASE" in the last 168 hours. No results for input(s): "AMMONIA" in the last 168 hours.  ABG No results found for: "PHART", "PCO2ART", "PO2ART", "HCO3", "TCO2", "ACIDBASEDEF", "O2SAT"   Coagulation Profile: No results for input(s): "INR", "PROTIME" in the  last 168 hours.  Cardiac Enzymes: No results for input(s): "CKTOTAL", "CKMB", "CKMBINDEX", "TROPONINI" in the last 168 hours.  HbA1C: No results found for: "HGBA1C"  CBG: Recent Labs  Lab 02/03/22 2227 02/04/22 0324 02/04/22 0734  GLUCAP 125* 128* 112*    Review of Systems:   +Cough, Congestion, mucous production    Time Spent: 45 minutes     Jovita Kussmaul, AGACNP-BC Please see [email or web address] for pager details If no response, please call [phone] After hours, please call Elink at [phone]

## 2022-02-05 DIAGNOSIS — T464X5A Adverse effect of angiotensin-converting-enzyme inhibitors, initial encounter: Secondary | ICD-10-CM | POA: Diagnosis not present

## 2022-02-05 DIAGNOSIS — T783XXA Angioneurotic edema, initial encounter: Secondary | ICD-10-CM | POA: Diagnosis not present

## 2022-02-05 LAB — CBC
HCT: 40 % (ref 36.0–46.0)
Hemoglobin: 13.8 g/dL (ref 12.0–15.0)
MCH: 32.2 pg (ref 26.0–34.0)
MCHC: 34.5 g/dL (ref 30.0–36.0)
MCV: 93.5 fL (ref 80.0–100.0)
Platelets: 257 10*3/uL (ref 150–400)
RBC: 4.28 MIL/uL (ref 3.87–5.11)
RDW: 14.2 % (ref 11.5–15.5)
WBC: 8.7 10*3/uL (ref 4.0–10.5)
nRBC: 0 % (ref 0.0–0.2)

## 2022-02-05 LAB — BASIC METABOLIC PANEL
Anion gap: 10 (ref 5–15)
BUN: 21 mg/dL (ref 8–23)
CO2: 22 mmol/L (ref 22–32)
Calcium: 9.3 mg/dL (ref 8.9–10.3)
Chloride: 104 mmol/L (ref 98–111)
Creatinine, Ser: 0.86 mg/dL (ref 0.44–1.00)
GFR, Estimated: >60 mL/min (ref >60)
Glucose, Bld: 143 mg/dL — ABNORMAL HIGH (ref 70–99)
Potassium: 4.3 mmol/L (ref 3.5–5.1)
Sodium: 136 mmol/L (ref 135–145)

## 2022-02-05 MED ORDER — NICOTINE 21 MG/24HR TD PT24: 21.0000 mg | MEDICATED_PATCH | Freq: Every day | TRANSDERMAL | 0 refills | Status: AC

## 2022-02-05 MED ORDER — AMLODIPINE BESYLATE 10 MG PO TABS: 10.0000 mg | ORAL_TABLET | Freq: Every day | ORAL | 0 refills | Status: AC

## 2022-02-05 MED ORDER — FLUTICASONE PROPIONATE 50 MCG/ACT NA SUSP: 1.0000 | Freq: Every day | NASAL | 0 refills | Status: AC

## 2022-02-05 MED ORDER — NICOTINE 21 MG/24HR TD PT24
21.0000 mg | MEDICATED_PATCH | Freq: Every day | TRANSDERMAL | Status: DC
  Administered 2022-02-05: 21 mg via TRANSDERMAL
  Filled 2022-02-05: qty 1

## 2022-02-05 NOTE — Discharge Summary (Signed)
Discharge Summary  Patient ID: Mary Bradshaw MRN: [id number] DOB/AGE: 1950-08-06 71 y.o.  Admit date: 02/03/2022 Discharge date: 02/05/2022  Admission Diagnoses: Angioedema, ACE inhibitor induced   Discharge Diagnoses:  Principal Problem:   Angioedema   Discharged Condition: good  Hospital Course: Mary Bradshaw, is a 71 y.o. female with PMH of HTN, active 1 ppd smoker, who presented to the [facility] with a chief complaint of tongue swelling.   Prior to arrival, patient stated that tongue started swelling unilaterally around 1pm on 8/4. Of note patient recently ran out of ACEi for about a month and resumed the medication around 7/6.   She was given benadryl, pepcid, and 10mg  decadron at [facility]. On room air, no resp distress, unilateral tongue swelling, no lip swelling, no hives. Throughout stay with resolution of swelling with stopping of ACE. Deemed appropriate for discharge 8/6 with PCP follow up in 1-2 weeks.    Significant Diagnostic Studies:  CXR 8/4 > Minimal atelectasis or infiltrate at the left lung base  Discharge Exam: Blood pressure 134/78, pulse 77, temperature 98.2 F (36.8 C), temperature source Oral, resp. rate (!) 22, height 5' (1.524 m), weight 43.1 kg, last menstrual period 10/25/1995, SpO2 97 %. General appearance: alert, cooperative, and appears stated age Throat: lips, mucosa, and tongue normal; teeth and gums normal Neck: no adenopathy, no carotid bruit, no JVD, supple, symmetrical, trachea midline, and thyroid not enlarged, symmetric, no tenderness/mass/nodules Resp: clear to auscultation bilaterally Cardio: regular rate and rhythm, S1, S2 normal, no murmur, click, rub or gallop GI: soft, non-tender; bowel sounds normal; no masses,  no organomegaly Extremities: extremities normal, atraumatic, no cyanosis or edema  Disposition: Discharge disposition: 01-Home or Self Care      Discharge Instructions     Diet general   Complete by: As directed    Increase  activity slowly   Complete by: As directed       Allergies as of 02/05/2022       Reactions   Lisinopril Anaphylaxis        Medication List     STOP taking these medications    azithromycin 250 MG tablet Commonly known as: ZITHROMAX   lisinopril-hydrochlorothiazide 20-12.5 MG tablet Commonly known as: ZESTORETIC   ondansetron 4 MG tablet Commonly known as: ZOFRAN   predniSONE 20 MG tablet Commonly known as: DELTASONE       TAKE these medications    albuterol 108 (90 Base) MCG/ACT inhaler Commonly known as: VENTOLIN HFA Inhale 1-2 puffs into the lungs every 6 (six) hours as needed for wheezing.   amLODipine 10 MG tablet Commonly known as: NORVASC Take 1 tablet (10 mg total) by mouth daily.   fluticasone 50 MCG/ACT nasal spray Commonly known as: FLONASE Place 1 spray into both nostrils daily.   mirtazapine 7.5 MG tablet Commonly known as: REMERON Take 7.5 mg by mouth at bedtime.   nicotine 21 mg/24hr patch Commonly known as: NICODERM CQ - dosed in mg/24 hours Place 1 patch (21 mg total) onto the skin daily.        Time Spent: 42 minutes  Signed: 04/07/2022, AGACNP-BC  Pulmonary & Critical Care  PCCM Pgr: [phone]

## 2022-02-05 NOTE — Plan of Care (Signed)

## 2022-02-05 NOTE — TOC Transition Note (Signed)
Transition of Care [facility]) - CM/SW Discharge Note   Patient Details  Name: Mary Bradshaw MRN: [id number] Date of Birth: June 21, 1951  [facility]) CM/SW Contact:  Bess Kinds, RN Phone Number: [phone] 02/05/2022, 8:33 AM   Clinical Narrative:     Patient to transition home today. No TOC needs identified at this time.   Final next level of care: Home/Self Care Barriers to Discharge: No Barriers Identified   Patient Goals and CMS Choice        Discharge Placement                       Discharge Plan and Services                                     Social Determinants of Health (SDOH) Interventions     Readmission Risk Interventions     No data to display

## 2022-08-03 ENCOUNTER — Emergency Department (HOSPITAL_BASED_OUTPATIENT_CLINIC_OR_DEPARTMENT_OTHER): Payer: Medicare Other

## 2022-08-03 ENCOUNTER — Inpatient Hospital Stay (HOSPITAL_BASED_OUTPATIENT_CLINIC_OR_DEPARTMENT_OTHER)
Admission: EM | Admit: 2022-08-03 | Discharge: 2022-08-08 | DRG: 871 | Disposition: A | Discharge status: Home / Self Care | Payer: Medicare Other | Attending: Family Medicine | Admitting: Family Medicine

## 2022-08-03 ENCOUNTER — Encounter (HOSPITAL_BASED_OUTPATIENT_CLINIC_OR_DEPARTMENT_OTHER): Payer: Self-pay

## 2022-08-03 ENCOUNTER — Other Ambulatory Visit: Payer: Self-pay

## 2022-08-03 DIAGNOSIS — I700 Atherosclerosis of aorta: Secondary | ICD-10-CM | POA: Diagnosis present

## 2022-08-03 DIAGNOSIS — Z681 Body mass index (BMI) 19 or less, adult: Secondary | ICD-10-CM | POA: Diagnosis not present

## 2022-08-03 DIAGNOSIS — R1033 Periumbilical pain: Secondary | ICD-10-CM | POA: Diagnosis present

## 2022-08-03 DIAGNOSIS — K529 Noninfective gastroenteritis and colitis, unspecified: Secondary | ICD-10-CM | POA: Diagnosis not present

## 2022-08-03 DIAGNOSIS — A0811 Acute gastroenteropathy due to Norwalk agent: Secondary | ICD-10-CM | POA: Diagnosis present

## 2022-08-03 DIAGNOSIS — I10 Essential (primary) hypertension: Secondary | ICD-10-CM | POA: Diagnosis present

## 2022-08-03 DIAGNOSIS — A419 Sepsis, unspecified organism: Principal | ICD-10-CM | POA: Insufficient documentation

## 2022-08-03 DIAGNOSIS — I2510 Atherosclerotic heart disease of native coronary artery without angina pectoris: Secondary | ICD-10-CM | POA: Diagnosis present

## 2022-08-03 DIAGNOSIS — Z888 Allergy status to other drugs, medicaments and biological substances status: Secondary | ICD-10-CM

## 2022-08-03 DIAGNOSIS — Z90710 Acquired absence of both cervix and uterus: Secondary | ICD-10-CM

## 2022-08-03 DIAGNOSIS — Z1152 Encounter for screening for COVID-19: Secondary | ICD-10-CM

## 2022-08-03 DIAGNOSIS — E43 Unspecified severe protein-calorie malnutrition: Secondary | ICD-10-CM | POA: Insufficient documentation

## 2022-08-03 DIAGNOSIS — F17210 Nicotine dependence, cigarettes, uncomplicated: Secondary | ICD-10-CM | POA: Diagnosis present

## 2022-08-03 DIAGNOSIS — Z9049 Acquired absence of other specified parts of digestive tract: Secondary | ICD-10-CM | POA: Diagnosis not present

## 2022-08-03 DIAGNOSIS — R1900 Intra-abdominal and pelvic swelling, mass and lump, unspecified site: Secondary | ICD-10-CM | POA: Diagnosis present

## 2022-08-03 LAB — COMPREHENSIVE METABOLIC PANEL
ALT: 10 U/L (ref 0–44)
AST: 18 U/L (ref 15–41)
Albumin: 3.5 g/dL (ref 3.5–5.0)
Alkaline Phosphatase: 72 U/L (ref 38–126)
Anion gap: 13 (ref 5–15)
BUN: 37 mg/dL — ABNORMAL HIGH (ref 8–23)
CO2: 23 mmol/L (ref 22–32)
Calcium: 9.4 mg/dL (ref 8.9–10.3)
Chloride: 101 mmol/L (ref 98–111)
Creatinine, Ser: 1.12 mg/dL — ABNORMAL HIGH (ref 0.44–1.00)
GFR, Estimated: 52 mL/min — ABNORMAL LOW (ref >60)
Glucose, Bld: 117 mg/dL — ABNORMAL HIGH (ref 70–99)
Potassium: 3.8 mmol/L (ref 3.5–5.1)
Sodium: 137 mmol/L (ref 135–145)
Total Bilirubin: 0.7 mg/dL (ref 0.3–1.2)
Total Protein: 8 g/dL (ref 6.5–8.1)

## 2022-08-03 LAB — LACTIC ACID, PLASMA
Lactic Acid, Venous: 0.9 mmol/L (ref 0.5–1.9)
Lactic Acid, Venous: 0.7 mmol/L (ref 0.5–1.9)

## 2022-08-03 LAB — URINALYSIS, ROUTINE W REFLEX MICROSCOPIC
Glucose, UA: NEGATIVE mg/dL
Ketones, ur: 15 mg/dL — AB
Nitrite: NEGATIVE
Protein, ur: 100 mg/dL — AB
Specific Gravity, Urine: 1.025 (ref 1.005–1.030)
pH: 5.5 (ref 5.0–8.0)

## 2022-08-03 LAB — CBC
HCT: 43.7 % (ref 36.0–46.0)
Hemoglobin: 14.7 g/dL (ref 12.0–15.0)
MCH: 31.2 pg (ref 26.0–34.0)
MCHC: 33.6 g/dL (ref 30.0–36.0)
MCV: 92.8 fL (ref 80.0–100.0)
Platelets: 222 10*3/uL (ref 150–400)
RBC: 4.71 MIL/uL (ref 3.87–5.11)
RDW: 14.1 % (ref 11.5–15.5)
WBC: 14.9 10*3/uL — ABNORMAL HIGH (ref 4.0–10.5)
nRBC: 0 % (ref 0.0–0.2)

## 2022-08-03 LAB — URINALYSIS, MICROSCOPIC (REFLEX)

## 2022-08-03 LAB — RESP PANEL BY RT-PCR (RSV, FLU A&B, COVID)  RVPGX2
Influenza A by PCR: NEGATIVE
Influenza B by PCR: NEGATIVE
Resp Syncytial Virus by PCR: NEGATIVE
SARS Coronavirus 2 by RT PCR: NEGATIVE

## 2022-08-03 LAB — PROCALCITONIN: Procalcitonin: 4.43 ng/mL

## 2022-08-03 LAB — LIPASE, BLOOD: Lipase: 21 U/L (ref 11–51)

## 2022-08-03 MED ORDER — ONDANSETRON HCL 4 MG/2ML IJ SOLN
4.0000 mg | Freq: Once | INTRAMUSCULAR | Status: AC
  Administered 2022-08-03: 4 mg via INTRAVENOUS
  Filled 2022-08-03: qty 2

## 2022-08-03 MED ORDER — SODIUM CHLORIDE 0.9 % IV BOLUS
500.0000 mL | Freq: Once | INTRAVENOUS | Status: AC
  Administered 2022-08-03: 500 mL via INTRAVENOUS

## 2022-08-03 MED ORDER — PIPERACILLIN-TAZOBACTAM 3.375 G IVPB 30 MIN
3.3750 g | Freq: Once | INTRAVENOUS | Status: AC
  Administered 2022-08-03: 3.375 g via INTRAVENOUS
  Filled 2022-08-03: qty 50

## 2022-08-03 MED ORDER — SODIUM CHLORIDE 0.9 % IV SOLN
INTRAVENOUS | Status: DC
  Administered 2022-08-03: 500 mL via INTRAVENOUS

## 2022-08-03 MED ORDER — IOHEXOL 300 MG/ML  SOLN
80.0000 mL | Freq: Once | INTRAMUSCULAR | Status: AC | PRN
  Administered 2022-08-03: 80 mL via INTRAVENOUS

## 2022-08-03 NOTE — H&P (Signed)
History and Physical    Donia Yokum NWG:[id number] DOB: [birth year] DOA: 08/03/2022  PCP: Jenel Lucks, PA-C  Patient coming from: [facility] ED  Chief Complaint: Abdominal pain  HPI: Mary Bradshaw is a 72 y.o. 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  In the ED, temperature 100.1 F and patient slightly tachycardic and tachypneic.  Not hypotensive.  Labs significant for WBC 14.9, BUN 37, creatinine 1.1 (baseline 0.8-1.0), lipase and LFTs normal, lactic acid normal x 2, procalcitonin 4.43.  UA with negative nitrite, trace leukocytes, and microscopy showing 6-10 RBCs, 0-5 WBCs, and few bacteria.  CT abdomen pelvis with contrast showing: "IMPRESSION: 1. There are diffuse focal inflammatory changes of the loops of large and small bowel located within the pelvis. These findings are favored to reflect enterocolitis. The etiology of this pelvic centered inflammation is unclear. However there are at least 2 peripherally enhancing crescentic fluid and air containing collections within the RIGHT lateral aspect of the pelvis. These are not definitively intraluminal in location and could reflect contained perforations or abscesses. This could be better assessed with dedicated CT abdomen pelvis with oral or rectal contrast to better delineate the bowel loops if further imaging is desired. 2. Bladder wall is mildly prominent for degree of decompression. This could be reactive due to pelvic inflammation. Recommend correlation with urinalysis to exclude cystitis.   Aortic Atherosclerosis (ICD10-I70.0)."  Chest x-ray negative for acute finding. Patient was given Zofran, Zosyn, and 500 cc normal saline bolus.  ED physician discussed the case with Dr. Marcello Moores with general  surgery who recommended CT with oral contrast.  General surgery team will see the patient in the morning.  Patient reports 5-day history of periumbilical abdominal pain radiating to her pelvic region.  She felt constipated and took a few doses of Dulcolax after which she started having vomiting and nonbloody diarrhea.  Reporting sharp abdominal pain which is worse with movement.  Denies any urinary symptoms.  No other complaints.  Denies cough, shortness of breath, or chest pain.  Review of Systems:  Review of Systems  All other systems reviewed and are negative.   Past Medical History:  Diagnosis Date   Headache    Hypertension    Kidney stones     Past Surgical History:  Procedure Laterality Date   ABDOMINAL HYSTERECTOMY     APPENDECTOMY     BREAST LUMPECTOMY     left side     reports that she has been smoking cigarettes. She has been smoking an average of 1 pack per day. She has never used smokeless tobacco. She reports that she does not drink alcohol and does not use drugs.  Allergies  Allergen Reactions   Lisinopril Anaphylaxis    History reviewed. No pertinent family history.  Prior to Admission medications   Medication Sig Start Date End Date Taking? Authorizing Provider  albuterol (PROVENTIL HFA;VENTOLIN HFA) 108 (90 Base) MCG/ACT inhaler Inhale 1-2 puffs into the lungs every 6 (six) hours as needed for wheezing. 07/13/17  Yes Tanna Furry, MD  amLODipine (NORVASC) 10 MG tablet Take 1 tablet (10 mg total) by mouth daily. 02/05/22  Yes Omar Person, NP  fluticasone (FLONASE) 50 MCG/ACT nasal spray Place 1 spray into both nostrils daily. Patient not taking: Reported on 08/03/2022 02/05/22   Omar Person, NP  nicotine (NICODERM CQ -  DOSED IN MG/24 HOURS) 21 mg/24hr patch Place 1 patch (21 mg total) onto the skin daily. Patient not taking: Reported on 08/03/2022 02/05/22   Omar Person, NP    Physical Exam: Vitals:   08/03/22 1930 08/03/22 2015 08/03/22 2053  08/03/22 2200  BP:   134/83 136/80  Pulse: (!) 102 96 97 (!) 108  Resp: (!) 27 (!) 24 (!) 23 20  Temp:    100.1 F (37.8 C)  TempSrc:      SpO2: 95% 95% 96% 96%  Weight:      Height:        Physical Exam Vitals reviewed.  Constitutional:      General: She is not in acute distress. HENT:     Head: Normocephalic and atraumatic.  Eyes:     Extraocular Movements: Extraocular movements intact.  Cardiovascular:     Rate and Rhythm: Regular rhythm. Tachycardia present.     Pulses: Normal pulses.  Pulmonary:     Effort: Pulmonary effort is normal. No respiratory distress.     Breath sounds: Normal breath sounds. No wheezing or rales.  Abdominal:     General: Bowel sounds are normal. There is no distension.     Palpations: Abdomen is soft.     Tenderness: There is abdominal tenderness. There is no rebound.     Comments: Generalized tenderness to palpation  Musculoskeletal:     Cervical back: Normal range of motion.     Right lower leg: No edema.     Left lower leg: No edema.  Skin:    General: Skin is warm and dry.  Neurological:     General: No focal deficit present.     Mental Status: She is alert and oriented to person, place, and time.     Labs on Admission: I have personally reviewed following labs and imaging studies  CBC: Recent Labs  Lab 08/03/22 1203  WBC 14.9*  HGB 14.7  HCT 43.7  MCV 92.8  PLT 630   Basic Metabolic Panel: Recent Labs  Lab 08/03/22 1203  NA 137  K 3.8  CL 101  CO2 23  GLUCOSE 117*  BUN 37*  CREATININE 1.12*  CALCIUM 9.4   GFR: Estimated Creatinine Clearance: 28.5 mL/min (A) (by C-G formula based on SCr of 1.12 mg/dL (H)). Liver Function Tests: Recent Labs  Lab 08/03/22 1203  AST 18  ALT 10  ALKPHOS 72  BILITOT 0.7  PROT 8.0  ALBUMIN 3.5   Recent Labs  Lab 08/03/22 1203  LIPASE 21   No results for input(s): "AMMONIA" in the last 168 hours. Coagulation Profile: No results for input(s): "INR", "PROTIME" in the last  168 hours. Cardiac Enzymes: No results for input(s): "CKTOTAL", "CKMB", "CKMBINDEX", "TROPONINI" in the last 168 hours. BNP (last 3 results) No results for input(s): "PROBNP" in the last 8760 hours. HbA1C: No results for input(s): "HGBA1C" in the last 72 hours. CBG: No results for input(s): "GLUCAP" in the last 168 hours. Lipid Profile: No results for input(s): "CHOL", "HDL", "LDLCALC", "TRIG", "CHOLHDL", "LDLDIRECT" in the last 72 hours. Thyroid Function Tests: No results for input(s): "TSH", "T4TOTAL", "FREET4", "T3FREE", "THYROIDAB" in the last 72 hours. Anemia Panel: No results for input(s): "VITAMINB12", "FOLATE", "FERRITIN", "TIBC", "IRON", "RETICCTPCT" in the last 72 hours. Urine analysis:    Component Value Date/Time   COLORURINE YELLOW 08/03/2022 [place] 08/03/2022 1540   LABSPEC 1.025 08/03/2022 1540   PHURINE 5.5 08/03/2022 1540   GLUCOSEU NEGATIVE 08/03/2022  1540   HGBUR LARGE (A) 08/03/2022 1540   BILIRUBINUR SMALL (A) 08/03/2022 1540   KETONESUR 15 (A) 08/03/2022 1540   PROTEINUR 100 (A) 08/03/2022 1540   UROBILINOGEN 0.2 12/16/2014 1900   NITRITE NEGATIVE 08/03/2022 1540   LEUKOCYTESUR TRACE (A) 08/03/2022 1540    Radiological Exams on Admission: DG Chest Port 1 View  Result Date: 08/03/2022 CLINICAL DATA:  cough and abdominal pain EXAM: PORTABLE CHEST 1 VIEW COMPARISON:  February 03, 2022 FINDINGS: The cardiomediastinal silhouette is unchanged in contour.Atherosclerotic calcifications of the aorta. No pleural effusion. No pneumothorax. No acute pleuroparenchymal abnormality. Excreted renal contrast. IMPRESSION: No acute cardiopulmonary abnormality. Electronically Signed   By: Meda Klinefelter M.D.   On: 08/03/2022 17:22   CT Abdomen Pelvis W Contrast  Result Date: 08/03/2022 CLINICAL DATA:  Bowel obstruction suspected Abdominal pain, acute, nonlocalized EXAM: CT ABDOMEN AND PELVIS WITH CONTRAST TECHNIQUE: Multidetector CT imaging of the abdomen  and pelvis was performed using the standard protocol following bolus administration of intravenous contrast. RADIATION DOSE REDUCTION: This exam was performed according to the departmental dose-optimization program which includes automated exposure control, adjustment of the mA and/or kV according to patient size and/or use of iterative reconstruction technique. CONTRAST:  39mL OMNIPAQUE IOHEXOL 300 MG/ML  SOLN COMPARISON:  May 03, 2022 FINDINGS: Lower chest: No acute abnormality. Hepatobiliary: Focal fatty deposition adjacent to the falciform ligament. Gallbladder is unremarkable. No extrahepatic biliary ductal dilation Pancreas: Unremarkable. No pancreatic ductal dilatation or surrounding inflammatory changes. Spleen: Normal in size without focal abnormality. Adrenals/Urinary Tract: Adrenal glands are unremarkable. Nonobstructing bilateral nephrolithiasis. Kidneys enhance symmetrically. No definitive obstructing nephrolithiasis are visualized. Bladder wall is mildly prominent for degree of decompression. Stomach/Bowel: There is circumferential wall thickening of the rectum, sigmoid colon and extending into the descending colon. Within the pelvis there are also several loops small bowel with thickened walls and submucosal enhancement. And the RIGHT lateral aspect of the pelvis, there are at least 2 peripherally enhancing crescentic fluid and air containing areas which are not definitively intraluminal in location. Each spans approximately 3 cm (series 5, image 30; series 6, image 39). Along the posterior sigmoid colon, there are several feculent foci which may be in diverticuli but are not definitive; these are of lower suspicion. (Series 2, image 52; series 2, image 57). Patient is status post appendectomy. Vascular/Lymphatic: Severe atherosclerotic calcifications of the aorta. 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 Reproductive: Surgically absent. Other: Small fat containing LEFT  inguinal hernia. Musculoskeletal: Levocurvature of the lumbar spine. IMPRESSION: 1. There are diffuse focal inflammatory changes of the loops of large and small bowel located within the pelvis. These findings are favored to reflect enterocolitis. The etiology of this pelvic centered inflammation is unclear. However there are at least 2 peripherally enhancing crescentic fluid and air containing collections within the RIGHT lateral aspect of the pelvis. These are not definitively intraluminal in location and could reflect contained perforations or abscesses. This could be better assessed with dedicated CT abdomen pelvis with oral or rectal contrast to better delineate the bowel loops if further imaging is desired. 2. Bladder wall is mildly prominent for degree of decompression. This could be reactive due to pelvic inflammation. Recommend correlation with urinalysis to exclude cystitis. Aortic Atherosclerosis (ICD10-I70.0). These results were called by telephone at the time of interpretation on 08/03/2022 at 5:15 pm to provider Vanetta Mulders , who verbally acknowledged these results. Electronically Signed   By: Meda Klinefelter M.D.   On: 08/03/2022 17:21  EKG: Independently reviewed.  Sinus rhythm, incomplete RBBB, LAFB, LVH.  No significant change compared to prior tracings.  Assessment and Plan  Sepsis secondary to enterocolitis, ?pelvic contained perforation versus abscesses Patient presenting with 5-day history of periumbilical abdominal pain radiating to her pelvic region.  She was constipated at home and took Dulcolax after which she started having vomiting and diarrhea. Mets SIRS criteria with low-grade fever, tachycardia, and tachypnea at the time of presentation.  WBC count 14.9.  Procalcitonin 4.43.  Lactic acid normal x 2 and not hypotensive.  CT showing showing diffuse focal inflammatory changes of the loops of large and small bowel located within the pelvis favored to reflect enterocolitis.   Also showing at least 2 peripherally enhancing crescentic fluid and air-containing collections within the right lateral aspect of the pelvis which are not definitively intraluminal in location and could reflect contained perforations or abscesses. -General surgery consulted.  Recommended repeating CT with oral contrast which has been ordered.  Patient is not actively vomiting. -Keep n.p.o. otherwise -Continue Zosyn -Continue IV fluids -Tylenol as needed for fevers -Antiemetic as needed -No blood cultures drawn in the ED -Trend WBC count and procalcitonin  ?Cystitis CT showing mildly prominent bladder wall for degree of decompression which could be reactive due to pelvic inflammation versus ?cystitis. UA with negative nitrite, trace leukocytes, and microscopy showing 6-10 RBCs, 0-5 WBCs, and few bacteria.  Patient is not endorsing any urinary symptoms. -Already on antibiotic -Add on urine culture  Hypertension Blood pressure stable. -Avoid antihypertensives at this time given concern for sepsis.  DVT prophylaxis: SCDs Code Status: Full Code (discussed with the patient) Consults called: General surgery Level of care: Progressive Care Unit Admission status: It is my clinical opinion that admission to INPATIENT is reasonable and necessary because of the expectation that this patient will require hospital care that crosses at least 2 midnights to treat this condition based on the medical complexity of the problems presented.  Given the aforementioned information, the predictability of an adverse outcome is felt to be significant.   Mary Leff MD [facility]  If 7PM-7AM, please contact night-coverage [email or web address]  08/03/2022, 10:25 PM

## 2022-08-03 NOTE — ED Notes (Signed)
Attempted call for report x2. Given faulty number [phone]. Unable to give report.

## 2022-08-03 NOTE — ED Notes (Signed)
Antony Blackbird, RN not in system. Called 4W back to change RN name to enable Purple Man.

## 2022-08-03 NOTE — ED Triage Notes (Signed)
C/o abdominal pain, constipation. Took dulcolax then started having diarrhea and vomiting. Symptoms x 5 days.

## 2022-08-03 NOTE — ED Notes (Signed)
Medic reported to this RN he attempted to give verbal report to floor, he spoke to inpatient RN and was requested to give "purple man"/electronic report. Medic attempted to locate inpatient RN in [facility] and was unable to. He called floor and spoke to inpatient charge, who in turn gave him a direct number to nurse, he called and the number did not work. At this point I called main line and spoke to Charge RN at the floor and explained the situation, and that [facility] was at bedside and patient would be on the way, if inpatient RN had any questions Medic was at the end of his shift. I gave Charge RN the number here if they had any questions. Charge stated understanding.

## 2022-08-03 NOTE — ED Notes (Signed)
Report given to Wells Guiles at [facility]. No ETA.

## 2022-08-03 NOTE — ED Provider Notes (Addendum)
[facility] Provider Note   CSN: [id number] Arrival date & time: 08/03/22  1145     History  Chief Complaint  Patient presents with   Abdominal Pain    Mary Bradshaw is a 72 y.o. female.  Patient with complaint of periumbilical abdominal pain since Sunday.  Associated with vomiting and diarrhea most of this occurred early on.  Also associated with cough and some congestion.  Patient wonders if she is constipated.  And took some Dulcolax.  Patient's past medical history significant for hypertension history of kidney stones.  Past surgical history significant for appendectomy and abdominal hysterectomy.  Patient is an everyday smoker.  Patient is never had pain like this before.  Temp on arrival was 98.5 heart rate 104 blood pressure 143/96.       Home Medications Prior to Admission medications   Medication Sig Start Date End Date Taking? Authorizing Provider  albuterol (PROVENTIL HFA;VENTOLIN HFA) 108 (90 Base) MCG/ACT inhaler Inhale 1-2 puffs into the lungs every 6 (six) hours as needed for wheezing. 07/13/17   Tanna Furry, MD  amLODipine (NORVASC) 10 MG tablet Take 1 tablet (10 mg total) by mouth daily. 02/05/22   Omar Person, NP  fluticasone (FLONASE) 50 MCG/ACT nasal spray Place 1 spray into both nostrils daily. 02/05/22   Omar Person, NP  mirtazapine (REMERON) 7.5 MG tablet Take 7.5 mg by mouth at bedtime. 01/13/22   [provider]  nicotine (NICODERM CQ - DOSED IN MG/24 HOURS) 21 mg/24hr patch Place 1 patch (21 mg total) onto the skin daily. 02/05/22   Omar Person, NP      Allergies    Lisinopril    Review of Systems   Review of Systems  Constitutional:  Negative for chills and fever.  HENT:  Positive for congestion. Negative for ear pain and sore throat.   Eyes:  Negative for pain and visual disturbance.  Respiratory:  Positive for cough. Negative for shortness of breath.   Cardiovascular:   Negative for chest pain and palpitations.  Gastrointestinal:  Positive for abdominal pain, diarrhea, nausea and vomiting. Negative for blood in stool.  Genitourinary:  Negative for dysuria and hematuria.  Musculoskeletal:  Negative for arthralgias and back pain.  Skin:  Negative for color change and rash.  Neurological:  Negative for seizures and syncope.  All other systems reviewed and are negative.   Physical Exam Updated Vital Signs BP (!) 142/82   Pulse 98   Temp 99.2 F (37.3 C)   Resp 15   Ht 1.524 m (5')   Wt 40.4 kg   LMP 10/25/1995   SpO2 96%   BMI 17.38 kg/m  Physical Exam Vitals and nursing note reviewed.  Constitutional:      General: She is not in acute distress.    Appearance: She is well-developed. She is not ill-appearing.  HENT:     Head: Normocephalic and atraumatic.     Mouth/Throat:     Comments: Mucous membranes slightly dry. Eyes:     Extraocular Movements: Extraocular movements intact.     Conjunctiva/sclera: Conjunctivae normal.     Pupils: Pupils are equal, round, and reactive to light.  Cardiovascular:     Rate and Rhythm: Normal rate and regular rhythm.     Heart sounds: No murmur heard. Pulmonary:     Effort: Pulmonary effort is normal. No respiratory distress.     Breath sounds: Normal breath sounds. No wheezing, rhonchi or rales.  Abdominal:     General: Abdomen is flat.     Palpations: Abdomen is soft.     Tenderness: There is no abdominal tenderness.     Hernia: No hernia is present.  Musculoskeletal:        General: No swelling.     Cervical back: Neck supple.  Skin:    General: Skin is warm and dry.     Capillary Refill: Capillary refill takes less than 2 seconds.  Neurological:     General: No focal deficit present.     Mental Status: She is alert and oriented to person, place, and time.  Psychiatric:        Mood and Affect: Mood normal.     ED Results / Procedures / Treatments   Labs (all labs ordered are listed, but  only abnormal results are displayed) Labs Reviewed  COMPREHENSIVE METABOLIC PANEL - Abnormal; Notable for the following components:      Result Value   Glucose, Bld 117 (*)    BUN 37 (*)    Creatinine, Ser 1.12 (*)    GFR, Estimated 52 (*)    All other components within normal limits  CBC - Abnormal; Notable for the following components:   WBC 14.9 (*)    All other components within normal limits  URINALYSIS, ROUTINE W REFLEX MICROSCOPIC - Abnormal; Notable for the following components:   Hgb urine dipstick LARGE (*)    Bilirubin Urine SMALL (*)    Ketones, ur 15 (*)    Protein, ur 100 (*)    Leukocytes,Ua TRACE (*)    All other components within normal limits  URINALYSIS, MICROSCOPIC (REFLEX) - Abnormal; Notable for the following components:   Bacteria, UA FEW (*)    All other components within normal limits  RESP PANEL BY RT-PCR (RSV, FLU A&B, COVID)  RVPGX2  LIPASE, BLOOD    EKG EKG Interpretation  Date/Time:  Thursday August 03 2022 16:50:46 EST Ventricular Rate:  96 PR Interval:  143 QRS Duration: 102 QT Interval:  352 QTC Calculation: 445 R Axis:   -56 Text Interpretation: Sinus rhythm Incomplete RBBB and LAFB Abnormal R-wave progression, early transition Left ventricular hypertrophy ST elevation, consider inferior injury Confirmed by Fredia Sorrow [phone]) on 08/03/2022 5:33:05 PM  Radiology DG Chest Port 1 View  Result Date: 08/03/2022 CLINICAL DATA:  cough and abdominal pain EXAM: PORTABLE CHEST 1 VIEW COMPARISON:  February 03, 2022 FINDINGS: The cardiomediastinal silhouette is unchanged in contour.Atherosclerotic calcifications of the aorta. No pleural effusion. No pneumothorax. No acute pleuroparenchymal abnormality. Excreted renal contrast. IMPRESSION: No acute cardiopulmonary abnormality. Electronically Signed   By: Valentino Saxon M.D.   On: 08/03/2022 17:22   CT Abdomen Pelvis W Contrast  Result Date: 08/03/2022 CLINICAL DATA:  Bowel obstruction suspected  Abdominal pain, acute, nonlocalized EXAM: CT ABDOMEN AND PELVIS WITH CONTRAST TECHNIQUE: Multidetector CT imaging of the abdomen and pelvis was performed using the standard protocol following bolus administration of intravenous contrast. RADIATION DOSE REDUCTION: This exam was performed according to the departmental dose-optimization program which includes automated exposure control, adjustment of the mA and/or kV according to patient size and/or use of iterative reconstruction technique. CONTRAST:  42mL OMNIPAQUE IOHEXOL 300 MG/ML  SOLN COMPARISON:  May 03, 2022 FINDINGS: Lower chest: No acute abnormality. Hepatobiliary: Focal fatty deposition adjacent to the falciform ligament. Gallbladder is unremarkable. No extrahepatic biliary ductal dilation Pancreas: Unremarkable. No pancreatic ductal dilatation or surrounding inflammatory changes. Spleen: Normal in size without focal abnormality. Adrenals/Urinary Tract: Adrenal  glands are unremarkable. Nonobstructing bilateral nephrolithiasis. Kidneys enhance symmetrically. No definitive obstructing nephrolithiasis are visualized. Bladder wall is mildly prominent for degree of decompression. Stomach/Bowel: There is circumferential wall thickening of the rectum, sigmoid colon and extending into the descending colon. Within the pelvis there are also several loops small bowel with thickened walls and submucosal enhancement. And the RIGHT lateral aspect of the pelvis, there are at least 2 peripherally enhancing crescentic fluid and air containing areas which are not definitively intraluminal in location. Each spans approximately 3 cm (series 5, image 30; series 6, image 39). Along the posterior sigmoid colon, there are several feculent foci which may be in diverticuli but are not definitive; these are of lower suspicion. (Series 2, image 52; series 2, image 57). Patient is status post appendectomy. Vascular/Lymphatic: Severe atherosclerotic calcifications of the aorta.  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 Reproductive: Surgically absent. Other: Small fat containing LEFT inguinal hernia. Musculoskeletal: Levocurvature of the lumbar spine. IMPRESSION: 1. There are diffuse focal inflammatory changes of the loops of large and small bowel located within the pelvis. These findings are favored to reflect enterocolitis. The etiology of this pelvic centered inflammation is unclear. However there are at least 2 peripherally enhancing crescentic fluid and air containing collections within the RIGHT lateral aspect of the pelvis. These are not definitively intraluminal in location and could reflect contained perforations or abscesses. This could be better assessed with dedicated CT abdomen pelvis with oral or rectal contrast to better delineate the bowel loops if further imaging is desired. 2. Bladder wall is mildly prominent for degree of decompression. This could be reactive due to pelvic inflammation. Recommend correlation with urinalysis to exclude cystitis. Aortic Atherosclerosis (ICD10-I70.0). These results were called by telephone at the time of interpretation on 08/03/2022 at 5:15 pm to provider Fredia Sorrow , who verbally acknowledged these results. Electronically Signed   By: Valentino Saxon M.D.   On: 08/03/2022 17:21    Procedures Procedures    Medications Ordered in ED Medications  0.9 %  sodium chloride infusion (has no administration in time range)  sodium chloride 0.9 % bolus 500 mL (500 mLs Intravenous New Bag/Given 08/03/22 1610)  ondansetron (ZOFRAN) injection 4 mg (4 mg Intravenous Given 08/03/22 1608)  iohexol (OMNIPAQUE) 300 MG/ML solution 80 mL (80 mLs Intravenous Contrast Given 08/03/22 1629)    ED Course/ Medical Decision Making/ A&P                             Medical Decision Making Amount and/or Complexity of Data Reviewed Labs: ordered. Radiology: ordered.  Risk Prescription drug management. Decision regarding  hospitalization.  Patient's abdomen soft and nontender.  But has had 5 days of abdominal pain.  Past abdominal surgery.  Patient's complete metabolic panel significant for BUN and creatinine being a little elevated which would go along with some mild dehydration.  GFR 52.  Electrolytes normal liver function tests normal.  Anion gap normal.  Lipase normal CBC significant for white count of 14.9.  Hemoglobin 14.7 platelets normal.  Will give some IV fluids some antinausea medicine will get chest x-ray respiratory panel and will get CT abdomen pelvis.  Patient's lab respiratory panel still pending.  Urinalysis with some RBCs some white blood cells few bacteria but not consistent with urinary tract infection.  Lipase 21 complete metabolic panel as mentioned above.  Chest x-ray with no acute findings.  CT scan abdomen pelvis with contrast IV  with's remarkable findings.  There is diffuse focal inflammatory changes in the loops of the large and small bowel located in the pelvis these findings are favored to reflect enterocolitis.  Etiology of this pelvic centered inflammation is unclear however there are at least 2 peripheral enhancing concentric fluid and air-containing collections within the right lateral aspect of the pelvis these are not definitely intraluminal in location and could reflect contained perforation or abscess.  This could be better assessed with a dedicated CT abdomen pelvis with oral or rectal contrast to better delineate the loops of bowel.  Bladder wall is mildly prominent for degree of decompression this could be reactive due to pelvic inflammation.  Recommend correlation with urinalysis to exclude cystitis.   Patient with a leukocytosis patient's abdomen though not necessarily consistent with an acute abdominal process.  But the CT findings are very worrisome.  Will discuss with on-call general surgery probably will be medicine admit.    Final Clinical Impression(s) / ED  Diagnoses Final diagnoses:  Periumbilical abdominal pain    Rx / DC Orders ED Discharge Orders     None         Vanetta Mulders, MD 08/03/22 1544    Vanetta Mulders, MD 08/03/22 1735

## 2022-08-03 NOTE — ED Notes (Signed)
Pt unable to obtain urine at this time, provided with specimen cup

## 2022-08-03 NOTE — ED Notes (Signed)
Called [facility] to give report, refused to take via phone. Requested use of [facility].

## 2022-08-03 NOTE — ED Notes (Signed)
[facility] at bedside 

## 2022-08-03 NOTE — ED Notes (Signed)
Called [facility] for transport to [place] @2013 

## 2022-08-04 ENCOUNTER — Inpatient Hospital Stay (HOSPITAL_COMMUNITY): Payer: Medicare Other

## 2022-08-04 DIAGNOSIS — A419 Sepsis, unspecified organism: Secondary | ICD-10-CM | POA: Diagnosis not present

## 2022-08-04 DIAGNOSIS — I2510 Atherosclerotic heart disease of native coronary artery without angina pectoris: Secondary | ICD-10-CM | POA: Insufficient documentation

## 2022-08-04 DIAGNOSIS — I10 Essential (primary) hypertension: Secondary | ICD-10-CM

## 2022-08-04 DIAGNOSIS — I700 Atherosclerosis of aorta: Secondary | ICD-10-CM | POA: Insufficient documentation

## 2022-08-04 DIAGNOSIS — K529 Noninfective gastroenteritis and colitis, unspecified: Secondary | ICD-10-CM | POA: Diagnosis not present

## 2022-08-04 DIAGNOSIS — E43 Unspecified severe protein-calorie malnutrition: Secondary | ICD-10-CM

## 2022-08-04 LAB — BASIC METABOLIC PANEL
Anion gap: 14 (ref 5–15)
BUN: 21 mg/dL (ref 8–23)
CO2: 19 mmol/L — ABNORMAL LOW (ref 22–32)
Calcium: 8.8 mg/dL — ABNORMAL LOW (ref 8.9–10.3)
Chloride: 102 mmol/L (ref 98–111)
Creatinine, Ser: 0.85 mg/dL (ref 0.44–1.00)
GFR, Estimated: >60 mL/min (ref >60)
Glucose, Bld: 74 mg/dL (ref 70–99)
Potassium: 3.3 mmol/L — ABNORMAL LOW (ref 3.5–5.1)
Sodium: 135 mmol/L (ref 135–145)

## 2022-08-04 LAB — CBC
HCT: 41.6 % (ref 36.0–46.0)
Hemoglobin: 13.5 g/dL (ref 12.0–15.0)
MCH: 31.6 pg (ref 26.0–34.0)
MCHC: 32.5 g/dL (ref 30.0–36.0)
MCV: 97.4 fL (ref 80.0–100.0)
Platelets: 193 10*3/uL (ref 150–400)
RBC: 4.27 MIL/uL (ref 3.87–5.11)
RDW: 14.5 % (ref 11.5–15.5)
WBC: 12.5 10*3/uL — ABNORMAL HIGH (ref 4.0–10.5)
nRBC: 0 % (ref 0.0–0.2)

## 2022-08-04 LAB — GASTROINTESTINAL PANEL BY PCR, STOOL (REPLACES STOOL CULTURE)

## 2022-08-04 LAB — PROCALCITONIN: Procalcitonin: 3.4 ng/mL

## 2022-08-04 MED ORDER — SODIUM CHLORIDE 0.9 % IV SOLN: INTRAVENOUS | Status: AC

## 2022-08-04 MED ORDER — ACETAMINOPHEN 650 MG RE SUPP: 650.0000 mg | Freq: Four times a day (QID) | RECTAL | Status: DC | PRN

## 2022-08-04 MED ORDER — IOHEXOL 9 MG/ML PO SOLN: 1000.0000 mL | ORAL | Status: AC

## 2022-08-04 MED ORDER — ONDANSETRON HCL 4 MG/2ML IJ SOLN: 4.0000 mg | Freq: Four times a day (QID) | INTRAMUSCULAR | Status: DC | PRN

## 2022-08-04 MED ORDER — POTASSIUM CHLORIDE 10 MEQ/100ML IV SOLN
10.0000 meq | INTRAVENOUS | Status: AC
  Administered 2022-08-04 (×4): 10 meq via INTRAVENOUS
  Filled 2022-08-04 (×4): qty 100

## 2022-08-04 MED ORDER — IOHEXOL 9 MG/ML PO SOLN
ORAL | Status: AC
  Administered 2022-08-04: 1000 mL via ORAL
  Filled 2022-08-04: qty 1000

## 2022-08-04 MED ORDER — ACETAMINOPHEN 325 MG PO TABS
650.0000 mg | ORAL_TABLET | Freq: Four times a day (QID) | ORAL | Status: DC | PRN
  Administered 2022-08-05 – 2022-08-06 (×2): 650 mg via ORAL
  Filled 2022-08-04 (×2): qty 2

## 2022-08-04 MED ORDER — PIPERACILLIN-TAZOBACTAM 3.375 G IVPB
3.3750 g | Freq: Three times a day (TID) | INTRAVENOUS | Status: DC
  Administered 2022-08-04 – 2022-08-07 (×10): 3.375 g via INTRAVENOUS
  Filled 2022-08-04 (×11): qty 50

## 2022-08-04 MED ORDER — MORPHINE SULFATE (PF) 2 MG/ML IV SOLN
2.0000 mg | INTRAVENOUS | Status: DC | PRN
  Administered 2022-08-04 – 2022-08-07 (×6): 2 mg via INTRAVENOUS
  Filled 2022-08-04 (×6): qty 1

## 2022-08-04 NOTE — Progress Notes (Signed)
PROGRESS NOTE    Mary Bradshaw  WEX:[id number] DOB: August 05, 1950 DOA: 08/03/2022 PCP: Jenel Lucks, PA-C   Brief Narrative: Mary Bradshaw is a 72 y.o. female with a history of hypertension, nephrolithiasis, tobacco use, angioedema secondary to ACEi. Patient presented secondary to abdominal pain with associated fever and leukocytosis and found to have enterocolitis with concern for possible perforation and early abscess. Empiric antibiotics started. General surgery consulted with recommendations for conservative management. NPO.   Assessment and Plan:  Sepsis Present on admission. Secondary to enterocolitis. No blood cultures obtained on admission. Empiric Zosyn started. -Obtain blood cultures (post-antibiotics)  Enterocolitis Presumed infectious. Empiric Zosyn started. Patient with sick contact. GI pathogen panel ordered. CT with evidence of associated colonic wall thickening involving the descending/sigmoid colon and rectum with associated possible perforation vs developing abscess. General surgery consulted. -Continue Zosyn -General surgery recommendations: Keep NPO, GI pathogen panel, continue conservative management -Morphine IV PRN for pain while NPO  Bladder wall thickening No symptoms. Possibly related to enterocolitis. Urine culture ordered after antibiotics. -Follow-up urine culture  Primary hypertension Patient is on amlodipine as an outpatient which was held on admission.  CAD Aortic atherosclerosis Noted on CT scan. -Check fasting lipid panel   DVT prophylaxis: SCDs Code Status:   Code Status: Full Code Family Communication: None at bedside Disposition Plan: Discharge home likely in 2-4 days pending improvement of abdominal pain and general surgery recommendations/management   Consultants:  General surgery  Procedures:  None  Antimicrobials: Zosyn IV    Subjective: Patient reports continued abdominal pain. No  nausea/vomiting.  Objective: BP 133/77 (BP Location: Left Arm)   Pulse 96   Temp 98.7 F (37.1 C) (Oral)   Resp 20   Ht 5' (1.524 m)   Wt 39.9 kg   LMP 10/25/1995   SpO2 98%   BMI 17.18 kg/m   Examination:  General exam: Appears calm and comfortable Respiratory system: Clear to auscultation. Respiratory effort normal. Cardiovascular system: S1 & S2 heard, RRR. Loud 3/6 systolic murmur Gastrointestinal system: Abdomen is nondistended, soft and with periumbilical tenderness. Normal bowel sounds heard. Central nervous system: Alert and oriented. No focal neurological deficits. Musculoskeletal: No edema. No calf tenderness Skin: No cyanosis. No rashes Psychiatry: Judgement and insight appear normal. Mood & affect appropriate.    Data Reviewed: I have personally reviewed following labs and imaging studies  CBC Lab Results  Component Value Date   WBC 12.5 (H) 08/04/2022   RBC 4.27 08/04/2022   HGB 13.5 08/04/2022   HCT 41.6 08/04/2022   MCV 97.4 08/04/2022   MCH 31.6 08/04/2022   PLT 193 08/04/2022   MCHC 32.5 08/04/2022   RDW 14.5 08/04/2022   LYMPHSABS 2.8 02/03/2022   MONOABS 0.5 02/03/2022   EOSABS 0.0 02/03/2022   BASOSABS 0.0 93/81/0175     Last metabolic panel Lab Results  Component Value Date   NA 135 08/04/2022   K 3.3 (L) 08/04/2022   CL 102 08/04/2022   CO2 19 (L) 08/04/2022   BUN 21 08/04/2022   CREATININE 0.85 08/04/2022   GLUCOSE 74 08/04/2022   GFRNONAA >60 08/04/2022   GFRAA >60 04/11/2019   CALCIUM 8.8 (L) 08/04/2022   PROT 8.0 08/03/2022   ALBUMIN 3.5 08/03/2022   BILITOT 0.7 08/03/2022   ALKPHOS 72 08/03/2022   AST 18 08/03/2022   ALT 10 08/03/2022   ANIONGAP 14 08/04/2022    GFR: Estimated Creatinine Clearance: 37.1 mL/min (by C-G formula based on SCr of 0.85 mg/dL).  Recent  Results (from the past 240 hour(s))  Resp panel by RT-PCR (RSV, Flu A&B, Covid) Anterior Nasal Swab     Status: None   Collection Time: 08/03/22  3:40 PM    Specimen: Anterior Nasal Swab  Result Value Ref Range Status   SARS Coronavirus 2 by RT PCR NEGATIVE NEGATIVE Final    Comment: (NOTE) SARS-CoV-2 target nucleic acids are NOT DETECTED.  The SARS-CoV-2 RNA is generally detectable in upper respiratory specimens during the acute phase of infection. The lowest concentration of SARS-CoV-2 viral copies this assay can detect is 138 copies/mL. A negative result does not preclude SARS-Cov-2 infection and should not be used as the sole basis for treatment or other patient management decisions. A negative result may occur with  improper specimen collection/handling, submission of specimen other than nasopharyngeal swab, presence of viral mutation(s) within the areas targeted by this assay, and inadequate number of viral copies(<138 copies/mL). A negative result must be combined with clinical observations, patient history, and epidemiological information. The expected result is Negative.  Fact Sheet for Patients:  EntrepreneurPulse.com.au  Fact Sheet for Healthcare Providers:  IncredibleEmployment.be  This test is no t yet approved or cleared by the [place] FDA and  has been authorized for detection and/or diagnosis of SARS-CoV-2 by FDA under an Emergency Use Authorization (EUA). This EUA will remain  in effect (meaning this test can be used) for the duration of the COVID-19 declaration under Section 564(b)(1) of the Act, 21 U.S.C.section 360bbb-3(b)(1), unless the authorization is terminated  or revoked sooner.       Influenza A by PCR NEGATIVE NEGATIVE Final   Influenza B by PCR NEGATIVE NEGATIVE Final    Comment: (NOTE) The Xpert Xpress SARS-CoV-2/FLU/RSV plus assay is intended as an aid in the diagnosis of influenza from Nasopharyngeal swab specimens and should not be used as a sole basis for treatment. Nasal washings and aspirates are unacceptable for Xpert Xpress  SARS-CoV-2/FLU/RSV testing.  Fact Sheet for Patients: EntrepreneurPulse.com.au  Fact Sheet for Healthcare Providers: IncredibleEmployment.be  This test is not yet approved or cleared by the [place] FDA and has been authorized for detection and/or diagnosis of SARS-CoV-2 by FDA under an Emergency Use Authorization (EUA). This EUA will remain in effect (meaning this test can be used) for the duration of the COVID-19 declaration under Section 564(b)(1) of the Act, 21 U.S.C. section 360bbb-3(b)(1), unless the authorization is terminated or revoked.     Resp Syncytial Virus by PCR NEGATIVE NEGATIVE Final    Comment: (NOTE) Fact Sheet for Patients: EntrepreneurPulse.com.au  Fact Sheet for Healthcare Providers: IncredibleEmployment.be  This test is not yet approved or cleared by the [place] FDA and has been authorized for detection and/or diagnosis of SARS-CoV-2 by FDA under an Emergency Use Authorization (EUA). This EUA will remain in effect (meaning this test can be used) for the duration of the COVID-19 declaration under Section 564(b)(1) of the Act, 21 U.S.C. section 360bbb-3(b)(1), unless the authorization is terminated or revoked.  Performed at [facility], [address] [place], [place] [address]       Radiology Studies: CT ABDOMEN PELVIS WO CONTRAST  Result Date: 08/04/2022 CLINICAL DATA:  Abdominal pain and constipation. Nausea and diarrhea. EXAM: CT ABDOMEN AND PELVIS WITHOUT CONTRAST TECHNIQUE: Multidetector CT imaging of the abdomen and pelvis was performed following the standard protocol without IV contrast. RADIATION DOSE REDUCTION: This exam was performed according to the departmental dose-optimization program which includes automated exposure control, adjustment of the  mA and/or kV according to patient size and/or use of iterative reconstruction technique. COMPARISON:   08/03/2022. FINDINGS: Lower chest: Scattered coronary artery calcifications are noted. A fat containing Bochdalek deck hernia is present on the right. Mild atelectasis at the lung bases. Hepatobiliary: No focal liver abnormality is seen. No gallstones, gallbladder wall thickening, or biliary dilatation. Pancreas: Unremarkable. No pancreatic ductal dilatation or surrounding inflammatory changes. Spleen: Normal in size without focal abnormality. Adrenals/Urinary Tract: The adrenal glands are within normal limits. Renal calculi are noted bilaterally. No hydronephrosis bilaterally. There is diffuse bladder wall thickening. Stomach/Bowel: There is gastric wall thickening at the fundus and gastric antrum. No bowel obstruction or pneumatosis. Scattered diverticula are present along the colon. Marked inflammatory changes are present in the pelvis with small-bowel and colonic and rectal wall thickening. There is a focal air pockets in the pelvis on the right with surrounding soft tissue density which does not opacify with contrast, best seen on axial image 61. Tiny foci of air are noted in the pelvis adjacent to the sigmoid colon and rectum.The possibility of walled-off perforation or developing abscess can not be excluded. A normal appendix is seen in the right lower quadrant. Vascular/Lymphatic: Aortic atherosclerosis and extensive atherosclerotic calcification of the common and femoral iliac arteries. No enlarged abdominal or pelvic lymph nodes. Reproductive: Status post hysterectomy. No adnexal masses. Other: Small amount of free fluid in the pelvis. Fat containing inguinal hernias are present bilaterally. Small fat containing umbilical hernia. Musculoskeletal: Degenerative changes in the thoracolumbar spine. No acute or suspicious osseous abnormality. IMPRESSION: 1. Colonic wall thickening involving the descending and sigmoid colon and rectum, compatible with colitis. 2. There are significant inflammatory changes in the  pelvis with focal pockets of air in the pelvis on the right and surrounding soft tissue density which does not opacify with contrast, best seen on axial image 61. Possibility of walled-off perforation or developing abscess can not be excluded, unchanged from the prior exam. 3. Tiny foci of air are noted in the pelvis which may represent air in scattered diverticula versus contained perforations, not significantly changed from the prior exam. 4. Diffuse bladder wall thickening, which may be related to local inflammatory changes in the pelvis. 5. Coronary artery calcifications and aortic atherosclerosis. 6. Bilateral nephrolithiasis. Electronically Signed   By: Thornell Sartorius M.D.   On: 08/04/2022 03:24   DG Chest Port 1 View  Result Date: 08/03/2022 CLINICAL DATA:  cough and abdominal pain EXAM: PORTABLE CHEST 1 VIEW COMPARISON:  February 03, 2022 FINDINGS: The cardiomediastinal silhouette is unchanged in contour.Atherosclerotic calcifications of the aorta. No pleural effusion. No pneumothorax. No acute pleuroparenchymal abnormality. Excreted renal contrast. IMPRESSION: No acute cardiopulmonary abnormality. Electronically Signed   By: Meda Klinefelter M.D.   On: 08/03/2022 17:22   CT Abdomen Pelvis W Contrast  Result Date: 08/03/2022 CLINICAL DATA:  Bowel obstruction suspected Abdominal pain, acute, nonlocalized EXAM: CT ABDOMEN AND PELVIS WITH CONTRAST TECHNIQUE: Multidetector CT imaging of the abdomen and pelvis was performed using the standard protocol following bolus administration of intravenous contrast. RADIATION DOSE REDUCTION: This exam was performed according to the departmental dose-optimization program which includes automated exposure control, adjustment of the mA and/or kV according to patient size and/or use of iterative reconstruction technique. CONTRAST:  14mL OMNIPAQUE IOHEXOL 300 MG/ML  SOLN COMPARISON:  May 03, 2022 FINDINGS: Lower chest: No acute abnormality. Hepatobiliary: Focal fatty  deposition adjacent to the falciform ligament. Gallbladder is unremarkable. No extrahepatic biliary ductal dilation Pancreas: Unremarkable. No pancreatic ductal  dilatation or surrounding inflammatory changes. Spleen: Normal in size without focal abnormality. Adrenals/Urinary Tract: Adrenal glands are unremarkable. Nonobstructing bilateral nephrolithiasis. Kidneys enhance symmetrically. No definitive obstructing nephrolithiasis are visualized. Bladder wall is mildly prominent for degree of decompression. Stomach/Bowel: There is circumferential wall thickening of the rectum, sigmoid colon and extending into the descending colon. Within the pelvis there are also several loops small bowel with thickened walls and submucosal enhancement. And the RIGHT lateral aspect of the pelvis, there are at least 2 peripherally enhancing crescentic fluid and air containing areas which are not definitively intraluminal in location. Each spans approximately 3 cm (series 5, image 30; series 6, image 39). Along the posterior sigmoid colon, there are several feculent foci which may be in diverticuli but are not definitive; these are of lower suspicion. (Series 2, image 52; series 2, image 57). Patient is status post appendectomy. Vascular/Lymphatic: Severe atherosclerotic calcifications of the aorta. 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 Reproductive: Surgically absent. Other: Small fat containing LEFT inguinal hernia. Musculoskeletal: Levocurvature of the lumbar spine. IMPRESSION: 1. There are diffuse focal inflammatory changes of the loops of large and small bowel located within the pelvis. These findings are favored to reflect enterocolitis. The etiology of this pelvic centered inflammation is unclear. However there are at least 2 peripherally enhancing crescentic fluid and air containing collections within the RIGHT lateral aspect of the pelvis. These are not definitively intraluminal in location and could  reflect contained perforations or abscesses. This could be better assessed with dedicated CT abdomen pelvis with oral or rectal contrast to better delineate the bowel loops if further imaging is desired. 2. Bladder wall is mildly prominent for degree of decompression. This could be reactive due to pelvic inflammation. Recommend correlation with urinalysis to exclude cystitis. Aortic Atherosclerosis (ICD10-I70.0). These results were called by telephone at the time of interpretation on 08/03/2022 at 5:15 pm to provider Fredia Sorrow , who verbally acknowledged these results. Electronically Signed   By: Valentino Saxon M.D.   On: 08/03/2022 17:21      LOS: 1 day    Cordelia Poche, MD [facility] 08/04/2022, 11:03 AM   If 7PM-7AM, please contact night-coverage [email or web address]

## 2022-08-04 NOTE — Hospital Course (Signed)
Mary Bradshaw is a 72 y.o. female with a history of hypertension, nephrolithiasis, tobacco use, angioedema secondary to ACEi. Patient presented secondary to abdominal pain with associated fever and leukocytosis and found to have enterocolitis with concern for possible perforation and early abscess. Empiric antibiotics started. General surgery consulted with recommendations for conservative management. NPO.

## 2022-08-04 NOTE — Consult Note (Signed)
Mary GuadalajaraLinda Bradshaw 03/29/1951  [id number].    Requesting MD: Dr. Jacquelin Hawkingalph Nettey Chief Complaint/Reason for Consult: Colitis  HPI: Mary GuadalajaraLinda Bradshaw is a 72 y.o. female with a hx of HTN and tobacco use who presented to the ED with abdominal pain.  Patient reports 6 days ago on Sunday, 1/28, she began having periumbilical abdominal pain radiating to her pelvic region.  Originally thought she was constipated after not having a bm for 4d so tried Dulcolax. Sunday morning her pain began w/ associated nausea, vomiting and diarrhea. Her nausea and vomiting resolved on Sunday but her pain and diarrhea persisted so she presented to the ED for evaluation. Denies fevers, melena, hematochezia, dysuria, pneumaturia, or flecks of stool in her urine.  No aggravating or relieving factors. Reports 2 episodes of watery stools per day, non-bloody, non-melanous. No recent abx, travel or suspicious foods. Does report contact w/ her grandson who recently had GI bug. No hx of similar in the past. Denies hx of Diverticulitis. Last colonoscopy in 2020 at [facility] which she reports was negative (per patient report - I cannot see these results in care everywhere). Past abdominal surgical hx includes appendectomy and abdominal hysterectomy. Denies personal or family hx of colon cancer or IBD. She is not on blood thinners.  In the ED Tmax 100.1.  She was initially tachycardic to 117 that improved with IVF.  Heart rate now in the 80s.  No hypotension.  WBC 14.9 > 12.5. Lactic acid wnl. CT A/P w/ IV contrast demonstrated diffuse focal inflammatory changes of the loops of large and small bowel located within the pelvis felt to reflect enterocolitis with at least 2 peripherally enhancing crescentic fluid and air containing collections within the RIGHT lateral aspect of the pelvis that could reflect contained perforations or abscesses. To better delineate this, a repeat CT scan with PO contrast (no IV contrast) was done and showed colonic wall  thickening involving the descending, sigmoid colon and rectum, compatible with colitis with significant inflammatory changes in the pelvis. There are tiny foci of air noted in the pelvis which may represent air in scattered diverticula versus contained perforations +/- developing abscess. The focal pockets of air in the pelvis on the right and surrounding soft tissue density does not opacify with contrast. There was also noted to be diffuse bladder wall thickening. Patient [facility] and started on abx.    ROS: ROS As above, see hpi  History reviewed. No pertinent family history.  Past Medical History:  Diagnosis Date   Headache    Hypertension    Kidney stones     Past Surgical History:  Procedure Laterality Date   ABDOMINAL HYSTERECTOMY     APPENDECTOMY     BREAST LUMPECTOMY     left side    Social History:  reports that she has been smoking cigarettes. She has been smoking an average of 1 pack per day. She has never used smokeless tobacco. She reports that she does not drink alcohol and does not use drugs. Smokes 1 PPD.  Lives alone.  Walks independently  Allergies:  Allergies  Allergen Reactions   Lisinopril Anaphylaxis    Medications Prior to Admission  Medication Sig Dispense Refill   albuterol (PROVENTIL HFA;VENTOLIN HFA) 108 (90 Base) MCG/ACT inhaler Inhale 1-2 puffs into the lungs every 6 (six) hours as needed for wheezing. 1 Inhaler 0   amLODipine (NORVASC) 10 MG tablet Take 1 tablet (10 mg total) by mouth daily. 60 tablet 0  fluticasone (FLONASE) 50 MCG/ACT nasal spray Place 1 spray into both nostrils daily. (Patient not taking: Reported on 08/03/2022) 10 mL 0   nicotine (NICODERM CQ - DOSED IN MG/24 HOURS) 21 mg/24hr patch Place 1 patch (21 mg total) onto the skin daily. (Patient not taking: Reported on 08/03/2022) 28 patch 0     Physical Exam: Blood pressure (!) 141/76, pulse 88, temperature 98.8 F (37.1 C), temperature source Oral, resp. rate 16, height  5' (1.524 m), weight 39.9 kg, last menstrual period 10/25/1995, SpO2 100 %. General: pleasant, WD/WN female who is laying in bed in NAD HEENT: head is normocephalic, atraumatic.  Sclera are noninjected.  PERRL.  Ears and nose without any masses or lesions.  Mouth is pink and moist. Edentulous (wears dentures at baseline) Heart: regular, rate, and rhythm.  Extremities wwp.  Lungs: CTAB, no wheezes, rhonchi, or rales noted.  Respiratory effort nonlabored Abd:  Soft, mild distension, periumbilical and suprapubic ttp. Initially had what appears to be voluntary guarding but this was distractible and on repeat evaluation had no guarding. No rigidity or rebound. Otherwise NT. +BS. Prior lower midline scar well healed.  MS: no BUE or BLE edema Skin: warm and dry  Psych: A&Ox4 with an appropriate affect Neuro: cranial nerves grossly intact, normal speech, thought process intact, moves all extremities, gait not assessed  Results for orders placed or performed during the hospital encounter of 08/03/22 (from the past 48 hour(s))  Lipase, blood     Status: None   Collection Time: 08/03/22 12:03 PM  Result Value Ref Range   Lipase 21 11 - 51 U/L    Comment: Performed at [facility], [address] [place], [place] [address]  Comprehensive metabolic panel     Status: Abnormal   Collection Time: 08/03/22 12:03 PM  Result Value Ref Range   Sodium 137 135 - 145 mmol/L   Potassium 3.8 3.5 - 5.1 mmol/L   Chloride 101 98 - 111 mmol/L   CO2 23 22 - 32 mmol/L   Glucose, Bld 117 (H) 70 - 99 mg/dL    Comment: Glucose reference range applies only to samples taken after fasting for at least 8 hours.   BUN 37 (H) 8 - 23 mg/dL   Creatinine, Ser 1.12 (H) 0.44 - 1.00 mg/dL   Calcium 9.4 8.9 - 10.3 mg/dL   Total Protein 8.0 6.5 - 8.1 g/dL   Albumin 3.5 3.5 - 5.0 g/dL   AST 18 15 - 41 U/L   ALT 10 0 - 44 U/L   Alkaline Phosphatase 72 38 - 126 U/L   Total Bilirubin 0.7 0.3 - 1.2 mg/dL   GFR, Estimated  52 (L) >60 mL/min    Comment: (NOTE) Calculated using the CKD-EPI Creatinine Equation (2021)    Anion gap 13 5 - 15    Comment: Performed at [facility], [address] [place], [place] [address]  CBC     Status: Abnormal   Collection Time: 08/03/22 12:03 PM  Result Value Ref Range   WBC 14.9 (H) 4.0 - 10.5 K/uL   RBC 4.71 3.87 - 5.11 MIL/uL   Hemoglobin 14.7 12.0 - 15.0 g/dL   HCT 43.7 36.0 - 46.0 %   MCV 92.8 80.0 - 100.0 fL   MCH 31.2 26.0 - 34.0 pg   MCHC 33.6 30.0 - 36.0 g/dL   RDW 14.1 11.5 - 15.5 %   Platelets 222 150 - 400 K/uL   nRBC  0.0 0.0 - 0.2 %    Comment: Performed at [facility], [address] [place], [place] [address]  Resp panel by RT-PCR (RSV, Flu A&B, Covid) Anterior Nasal Swab     Status: None   Collection Time: 08/03/22  3:40 PM   Specimen: Anterior Nasal Swab  Result Value Ref Range   SARS Coronavirus 2 by RT PCR NEGATIVE NEGATIVE    Comment: (NOTE) SARS-CoV-2 target nucleic acids are NOT DETECTED.  The SARS-CoV-2 RNA is generally detectable in upper respiratory specimens during the acute phase of infection. The lowest concentration of SARS-CoV-2 viral copies this assay can detect is 138 copies/mL. A negative result does not preclude SARS-Cov-2 infection and should not be used as the sole basis for treatment or other patient management decisions. A negative result may occur with  improper specimen collection/handling, submission of specimen other than nasopharyngeal swab, presence of viral mutation(s) within the areas targeted by this assay, and inadequate number of viral copies(<138 copies/mL). A negative result must be combined with clinical observations, patient history, and epidemiological information. The expected result is Negative.  Fact Sheet for Patients:  EntrepreneurPulse.com.au  Fact Sheet for Healthcare Providers:  IncredibleEmployment.be  This test is no t yet approved or  cleared by the [place] FDA and  has been authorized for detection and/or diagnosis of SARS-CoV-2 by FDA under an Emergency Use Authorization (EUA). This EUA will remain  in effect (meaning this test can be used) for the duration of the COVID-19 declaration under Section 564(b)(1) of the Act, 21 U.S.C.section 360bbb-3(b)(1), unless the authorization is terminated  or revoked sooner.       Influenza A by PCR NEGATIVE NEGATIVE   Influenza B by PCR NEGATIVE NEGATIVE    Comment: (NOTE) The Xpert Xpress SARS-CoV-2/FLU/RSV plus assay is intended as an aid in the diagnosis of influenza from Nasopharyngeal swab specimens and should not be used as a sole basis for treatment. Nasal washings and aspirates are unacceptable for Xpert Xpress SARS-CoV-2/FLU/RSV testing.  Fact Sheet for Patients: EntrepreneurPulse.com.au  Fact Sheet for Healthcare Providers: IncredibleEmployment.be  This test is not yet approved or cleared by the [place] FDA and has been authorized for detection and/or diagnosis of SARS-CoV-2 by FDA under an Emergency Use Authorization (EUA). This EUA will remain in effect (meaning this test can be used) for the duration of the COVID-19 declaration under Section 564(b)(1) of the Act, 21 U.S.C. section 360bbb-3(b)(1), unless the authorization is terminated or revoked.     Resp Syncytial Virus by PCR NEGATIVE NEGATIVE    Comment: (NOTE) Fact Sheet for Patients: EntrepreneurPulse.com.au  Fact Sheet for Healthcare Providers: IncredibleEmployment.be  This test is not yet approved or cleared by the [place] FDA and has been authorized for detection and/or diagnosis of SARS-CoV-2 by FDA under an Emergency Use Authorization (EUA). This EUA will remain in effect (meaning this test can be used) for the duration of the COVID-19 declaration under Section 564(b)(1) of the Act, 21 U.S.C. section  360bbb-3(b)(1), unless the authorization is terminated or revoked.  Performed at [facility], [address] [place], [place] [address]   Urinalysis, Routine w reflex microscopic -Urine, Clean Catch     Status: Abnormal   Collection Time: 08/03/22  3:40 PM  Result Value Ref Range   Color, Urine YELLOW YELLOW   APPearance CLEAR CLEAR   Specific Gravity, Urine 1.025 1.005 - 1.030   pH 5.5 5.0 - 8.0   Glucose, UA NEGATIVE  NEGATIVE mg/dL   Hgb urine dipstick LARGE (A) NEGATIVE   Bilirubin Urine SMALL (A) NEGATIVE   Ketones, ur 15 (A) NEGATIVE mg/dL   Protein, ur 604 (A) NEGATIVE mg/dL   Nitrite NEGATIVE NEGATIVE   Leukocytes,Ua TRACE (A) NEGATIVE    Comment: Performed at [facility], [address] [place], [place] [address]  Urinalysis, Microscopic (reflex)     Status: Abnormal   Collection Time: 08/03/22  3:40 PM  Result Value Ref Range   RBC / HPF 6-10 0 - 5 RBC/hpf   WBC, UA 0-5 0 - 5 WBC/hpf   Bacteria, UA FEW (A) NONE SEEN   Squamous Epithelial / HPF 0-5 0 - 5 /HPF    Comment: Performed at [facility], [address] [place], [place] [address]  Lactic acid, plasma     Status: None   Collection Time: 08/03/22  6:36 PM  Result Value Ref Range   Lactic Acid, Venous 0.9 0.5 - 1.9 mmol/L    Comment: Performed at [facility], [address] [place], [place] [address]  Procalcitonin - Baseline     Status: None   Collection Time: 08/03/22  6:36 PM  Result Value Ref Range   Procalcitonin 4.43 ng/mL    Comment:        Interpretation: PCT > 2 ng/mL: Systemic infection (sepsis) is likely, unless other causes are known. (NOTE)       Sepsis PCT Algorithm           Lower Respiratory Tract                                      Infection PCT Algorithm    ----------------------------     ----------------------------         PCT < 0.25 ng/mL                PCT < 0.10 ng/mL          Strongly encourage             Strongly discourage    discontinuation of antibiotics    initiation of antibiotics    ----------------------------     -----------------------------       PCT 0.25 - 0.50 ng/mL            PCT 0.10 - 0.25 ng/mL               OR       >80% decrease in PCT            Discourage initiation of                                            antibiotics      Encourage discontinuation           of antibiotics    ----------------------------     -----------------------------         PCT >= 0.50 ng/mL              PCT 0.26 - 0.50 ng/mL               AND       <80% decrease in PCT  Encourage initiation of                                             antibiotics       Encourage continuation           of antibiotics    ----------------------------     -----------------------------        PCT >= 0.50 ng/mL                  PCT > 0.50 ng/mL               AND         increase in PCT                  Strongly encourage                                      initiation of antibiotics    Strongly encourage escalation           of antibiotics                                     -----------------------------                                           PCT <= 0.25 ng/mL                                                 OR                                        > 80% decrease in PCT                                      Discontinue / Do not initiate                                             antibiotics  Performed at [facility], [address] [place], [place] [address]   Lactic acid, plasma     Status: None   Collection Time: 08/03/22  8:56 PM  Result Value Ref Range   Lactic Acid, Venous 0.7 0.5 - 1.9 mmol/L    Comment: Performed at [facility] [place] [facility], [address] [place], [place] [address]  Basic metabolic panel     Status: Abnormal   Collection Time: 08/04/22  3:53 AM  Result Value Ref Range   Sodium 135 135 - 145 mmol/L   Potassium 3.3 (L) 3.5 - 5.1 mmol/L   Chloride 102 98 - 111 mmol/L   CO2  19 (L) 22 - 32 mmol/L  Glucose, Bld 74 70 - 99 mg/dL    Comment: Glucose reference range applies only to samples taken after fasting for at least 8 hours.   BUN 21 8 - 23 mg/dL   Creatinine, Ser 8.460.85 0.44 - 1.00 mg/dL   Calcium 8.8 (L) 8.9 - 10.3 mg/dL   GFR, Estimated >96>60 >29>60 mL/min    Comment: (NOTE) Calculated using the CKD-EPI Creatinine Equation (2021)    Anion gap 14 5 - 15    Comment: Performed at [facility], [address] [place], [place] [address]  CBC     Status: Abnormal   Collection Time: 08/04/22  3:53 AM  Result Value Ref Range   WBC 12.5 (H) 4.0 - 10.5 K/uL   RBC 4.27 3.87 - 5.11 MIL/uL   Hemoglobin 13.5 12.0 - 15.0 g/dL   HCT 32.441.6 40.136.0 - 02.746.0 %   MCV 97.4 80.0 - 100.0 fL   MCH 31.6 26.0 - 34.0 pg   MCHC 32.5 30.0 - 36.0 g/dL   RDW 25.314.5 66.411.5 - 40.315.5 %   Platelets 193 150 - 400 K/uL   nRBC 0.0 0.0 - 0.2 %    Comment: Performed at [facility], [address] [place], [place] [address]  Procalcitonin     Status: None   Collection Time: 08/04/22  3:53 AM  Result Value Ref Range   Procalcitonin 3.40 ng/mL    Comment:        Interpretation: PCT > 2 ng/mL: Systemic infection (sepsis) is likely, unless other causes are known. (NOTE)       Sepsis PCT Algorithm           Lower Respiratory Tract                                      Infection PCT Algorithm    ----------------------------     ----------------------------         PCT < 0.25 ng/mL                PCT < 0.10 ng/mL          Strongly encourage             Strongly discourage   discontinuation of antibiotics    initiation of antibiotics    ----------------------------     -----------------------------       PCT 0.25 - 0.50 ng/mL            PCT 0.10 - 0.25 ng/mL               OR       >80% decrease in PCT            Discourage initiation of                                            antibiotics      Encourage discontinuation           of antibiotics     ----------------------------     -----------------------------         PCT >= 0.50 ng/mL              PCT 0.26 - 0.50 ng/mL               AND       <  80% decrease in PCT              Encourage initiation of                                             antibiotics       Encourage continuation           of antibiotics    ----------------------------     -----------------------------        PCT >= 0.50 ng/mL                  PCT > 0.50 ng/mL               AND         increase in PCT                  Strongly encourage                                      initiation of antibiotics    Strongly encourage escalation           of antibiotics                                     -----------------------------                                           PCT <= 0.25 ng/mL                                                 OR                                        > 80% decrease in PCT                                      Discontinue / Do not initiate                                             antibiotics  Performed at [facility] [address], [place], [place] [address]    CT ABDOMEN PELVIS WO CONTRAST  Result Date: 08/04/2022 CLINICAL DATA:  Abdominal pain and constipation. Nausea and diarrhea. EXAM: CT ABDOMEN AND PELVIS WITHOUT CONTRAST TECHNIQUE: Multidetector CT imaging of the abdomen and pelvis was performed following the standard protocol without IV contrast. RADIATION DOSE REDUCTION: This exam was performed according to the departmental dose-optimization program which includes automated exposure control, adjustment of the mA and/or kV according to patient size and/or use of iterative reconstruction technique. COMPARISON:  08/03/2022. FINDINGS: Lower chest: Scattered coronary artery calcifications are noted. A  fat containing Bochdalek deck hernia is present on the right. Mild atelectasis at the lung bases. Hepatobiliary: No focal liver abnormality is seen. No gallstones,  gallbladder wall thickening, or biliary dilatation. Pancreas: Unremarkable. No pancreatic ductal dilatation or surrounding inflammatory changes. Spleen: Normal in size without focal abnormality. Adrenals/Urinary Tract: The adrenal glands are within normal limits. Renal calculi are noted bilaterally. No hydronephrosis bilaterally. There is diffuse bladder wall thickening. Stomach/Bowel: There is gastric wall thickening at the fundus and gastric antrum. No bowel obstruction or pneumatosis. Scattered diverticula are present along the colon. Marked inflammatory changes are present in the pelvis with small-bowel and colonic and rectal wall thickening. There is a focal air pockets in the pelvis on the right with surrounding soft tissue density which does not opacify with contrast, best seen on axial image 61. Tiny foci of air are noted in the pelvis adjacent to the sigmoid colon and rectum.The possibility of walled-off perforation or developing abscess can not be excluded. A normal appendix is seen in the right lower quadrant. Vascular/Lymphatic: Aortic atherosclerosis and extensive atherosclerotic calcification of the common and femoral iliac arteries. No enlarged abdominal or pelvic lymph nodes. Reproductive: Status post hysterectomy. No adnexal masses. Other: Small amount of free fluid in the pelvis. Fat containing inguinal hernias are present bilaterally. Small fat containing umbilical hernia. Musculoskeletal: Degenerative changes in the thoracolumbar spine. No acute or suspicious osseous abnormality. IMPRESSION: 1. Colonic wall thickening involving the descending and sigmoid colon and rectum, compatible with colitis. 2. There are significant inflammatory changes in the pelvis with focal pockets of air in the pelvis on the right and surrounding soft tissue density which does not opacify with contrast, best seen on axial image 61. Possibility of walled-off perforation or developing abscess can not be excluded,  unchanged from the prior exam. 3. Tiny foci of air are noted in the pelvis which may represent air in scattered diverticula versus contained perforations, not significantly changed from the prior exam. 4. Diffuse bladder wall thickening, which may be related to local inflammatory changes in the pelvis. 5. Coronary artery calcifications and aortic atherosclerosis. 6. Bilateral nephrolithiasis. Electronically Signed   By: Thornell Sartorius M.D.   On: 08/04/2022 03:24   DG Chest Port 1 View  Result Date: 08/03/2022 CLINICAL DATA:  cough and abdominal pain EXAM: PORTABLE CHEST 1 VIEW COMPARISON:  February 03, 2022 FINDINGS: The cardiomediastinal silhouette is unchanged in contour.Atherosclerotic calcifications of the aorta. No pleural effusion. No pneumothorax. No acute pleuroparenchymal abnormality. Excreted renal contrast. IMPRESSION: No acute cardiopulmonary abnormality. Electronically Signed   By: Meda Klinefelter M.D.   On: 08/03/2022 17:22   CT Abdomen Pelvis W Contrast  Result Date: 08/03/2022 CLINICAL DATA:  Bowel obstruction suspected Abdominal pain, acute, nonlocalized EXAM: CT ABDOMEN AND PELVIS WITH CONTRAST TECHNIQUE: Multidetector CT imaging of the abdomen and pelvis was performed using the standard protocol following bolus administration of intravenous contrast. RADIATION DOSE REDUCTION: This exam was performed according to the departmental dose-optimization program which includes automated exposure control, adjustment of the mA and/or kV according to patient size and/or use of iterative reconstruction technique. CONTRAST:  39mL OMNIPAQUE IOHEXOL 300 MG/ML  SOLN COMPARISON:  May 03, 2022 FINDINGS: Lower chest: No acute abnormality. Hepatobiliary: Focal fatty deposition adjacent to the falciform ligament. Gallbladder is unremarkable. No extrahepatic biliary ductal dilation Pancreas: Unremarkable. No pancreatic ductal dilatation or surrounding inflammatory changes. Spleen: Normal in size without focal  abnormality. Adrenals/Urinary Tract: Adrenal glands are unremarkable. Nonobstructing bilateral nephrolithiasis. Kidneys enhance symmetrically. No definitive  obstructing nephrolithiasis are visualized. Bladder wall is mildly prominent for degree of decompression. Stomach/Bowel: There is circumferential wall thickening of the rectum, sigmoid colon and extending into the descending colon. Within the pelvis there are also several loops small bowel with thickened walls and submucosal enhancement. And the RIGHT lateral aspect of the pelvis, there are at least 2 peripherally enhancing crescentic fluid and air containing areas which are not definitively intraluminal in location. Each spans approximately 3 cm (series 5, image 30; series 6, image 39). Along the posterior sigmoid colon, there are several feculent foci which may be in diverticuli but are not definitive; these are of lower suspicion. (Series 2, image 52; series 2, image 57). Patient is status post appendectomy. Vascular/Lymphatic: Severe atherosclerotic calcifications of the aorta. 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 Reproductive: Surgically absent. Other: Small fat containing LEFT inguinal hernia. Musculoskeletal: Levocurvature of the lumbar spine. IMPRESSION: 1. There are diffuse focal inflammatory changes of the loops of large and small bowel located within the pelvis. These findings are favored to reflect enterocolitis. The etiology of this pelvic centered inflammation is unclear. However there are at least 2 peripherally enhancing crescentic fluid and air containing collections within the RIGHT lateral aspect of the pelvis. These are not definitively intraluminal in location and could reflect contained perforations or abscesses. This could be better assessed with dedicated CT abdomen pelvis with oral or rectal contrast to better delineate the bowel loops if further imaging is desired. 2. Bladder wall is mildly prominent for degree  of decompression. This could be reactive due to pelvic inflammation. Recommend correlation with urinalysis to exclude cystitis. Aortic Atherosclerosis (ICD10-I70.0). These results were called by telephone at the time of interpretation on 08/03/2022 at 5:15 pm to provider Vanetta Mulders , who verbally acknowledged these results. Electronically Signed   By: Meda Klinefelter M.D.   On: 08/03/2022 17:21    Anti-infectives (From admission, onward)    Start     Dose/Rate Route Frequency Ordered Stop   08/04/22 0200  piperacillin-tazobactam (ZOSYN) IVPB 3.375 g        3.375 g 12.5 mL/hr over 240 Minutes Intravenous Every 8 hours 08/04/22 0029     08/03/22 1745  piperacillin-tazobactam (ZOSYN) IVPB 3.375 g        3.375 g 100 mL/hr over 30 Minutes Intravenous  Once 08/03/22 1741 08/03/22 1843       Assessment/Plan Colitis with possible contained perforation and developing abscess Patient has been seen and examined. This is a 72 y.o. female who has abdominal pain, n/v/d x 6d. CT w/ colonic wall thickening involving the descending, sigmoid colon and rectum, compatible with colitis with significant inflammatory changes in the pelvis and tiny foci of air noted in the pelvis which may represent air in scattered diverticula versus contained perforations +/- developing abscess. The focal pockets of air in the pelvis on the right and surrounding soft tissue density does not opacify with contrast. Patient is currently HDS without fever, tachycardia or hypotension. WBC downtrending. Abdominal exam as above. Initially had some voluntary guarding but this was distractible and on repeat exam she was without involuntary guarding, rigidity or rebound (no recent pain meds that I can see in [facility]). No current indication for emergency surgery. Agree with IV antibiotics. Keep NPO. I do not think there is anything drainable by IR at this time. Would check GI panel with hx of sick contact (grandson) w/ GI bug. Hopefully patient  will improve with conservative treatment.  If patient fails  to improve they may require repeating imaging, drain placement, or surgical intervention resulting in a colectomy/colostomy.  This was discussed with the patient.  FEN - NPO, IVF per [facility] VTE - SCDs, okay for chem ppx from a general surgery standpoint ID - Zosyn Foley - None   I reviewed nursing notes, ED provider notes, hospitalist notes, last 24 h vitals and pain scores, last 48 h intake and output, last 24 h labs and trends. I personally reviewed the last 24 h imaging results.  Jacinto Halim, [facility] Surgery 08/04/2022, 6:44 AM Please see Amion for pager number during day hours 7:00am-4:30pm

## 2022-08-04 NOTE — Progress Notes (Signed)
Initial Nutrition Assessment  DOCUMENTATION CODES:   Severe malnutrition in context of chronic illness  INTERVENTION:  - Advance diet as medically appropriate. - Recommend Boost Plus po TID once diet advanced, each supplement provides 360 kcal and 14 grams of protein - Encourage intake as tolerated.  - Recommend daily multivitamin to support micronutrient needs.  - Monitor weight trends.    NUTRITION DIAGNOSIS:   Severe Malnutrition related to chronic illness as evidenced by severe fat depletion, severe muscle depletion.  GOAL:   Patient will meet greater than or equal to 90% of their needs  MONITOR:   PO intake, Supplement acceptance, Diet advancement, Weight trends, Labs  REASON FOR ASSESSMENT:   Malnutrition Screening Tool    ASSESSMENT:   72 y.o. 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  Patient sleepy at time of visit. Reports a UBW of 100# and that she has lost "a lot of weight" recently. Unsure of an exact time frame or cause of weight loss. Per EMR, patient weighed at 95# in August and now weighed at 88# - a 7# or 7.4% weight loss in 6 months. She was also weighed at 92# during an office visit in November which would be a 4# or 4% weight loss in 3 months. Both weight changes are not significant for the time frame.  Patient reports she typically eats 3 times a day, usually breakfast, dinner and a bedtime snack. Occasionally drinks Boost but notes it can be expensive. She also admits she has not had anything to eat since Sunday (5 days ago).    Patient hungry but understands she is NPO. Per Surgery note today patient with colitis with possible contained perforation and developing abscess.   Patient would like Boost once her diet is able to be  advanced.   Medications reviewed and include: -  Labs reviewed:  K+ 3.3   NUTRITION - FOCUSED PHYSICAL EXAM:  Flowsheet Row Most Recent Value  Orbital Region Moderate depletion  Upper Arm Region Severe depletion  Thoracic and Lumbar Region Severe depletion  Buccal Region Moderate depletion  Temple Region Moderate depletion  Clavicle Bone Region Moderate depletion  Clavicle and Acromion Bone Region Severe depletion  Scapular Bone Region Unable to assess  Dorsal Hand Severe depletion  Patellar Region Severe depletion  Anterior Thigh Region Severe depletion  Posterior Calf Region Severe depletion  Edema (RD Assessment) None  Hair Reviewed  Eyes Reviewed  Mouth Reviewed  Skin Reviewed  Nails Reviewed       Diet Order:   Diet Order             Diet NPO time specified  Diet effective now                   EDUCATION NEEDS:  Education needs have been addressed  Skin:  Skin Assessment: Reviewed RN Assessment  Last BM:  2/2  Height:  Ht Readings from Last 1 Encounters:  08/03/22 5' (1.524 m)   Weight:  Wt Readings from Last 1 Encounters:  08/03/22 39.9 kg    BMI:  Body mass index is 17.18 kg/m.  Estimated Nutritional Needs:  Kcal:  1400-1600 kcals Protein:  60-80 grams Fluid:  >/= 1.4L    Samson Frederic RD, LDN For contact information, refer to [facility].

## 2022-08-04 NOTE — Progress Notes (Signed)
Chaplain received a consult for assistance with advance directives.  Chaplain met with Vaughan Basta and provided education about the paperwork.  Amy wants to assign her two children.  As she is not married, Chaplain explained that they would automatically be her next of kin with medical decision making power.  She had not made any decision about what her wishes would be and chaplain encouraged her to read through the document with her children and to talk about what her decisions would be before filling out paperwork.  She agreed to this.  Chaplain assessed for additional needs, but Klani stated that she is doing okay for now.  [address] Pager, [phone]

## 2022-08-04 NOTE — Progress Notes (Signed)
Pharmacy Antibiotic Note  Mary Bradshaw is a 72 y.o. female admitted on 08/03/2022 with sepsis secondary to intra-abdominal infection.  Pharmacy has been consulted for Zosyn dosing.  Plan: Zosyn 3.375g IV q8h (4 hour infusion). No dose adjustments anticipated.  Pharmacy will sign off and monitor peripherally via electronic surveillance software for any changes in renal function or micro data.   Height: 5' (152.4 cm) Weight: 39.9 kg (87 lb 15.4 oz) IBW/kg (Calculated) : 45.5  Temp (24hrs), Avg:99.3 F (37.4 C), Min:98.5 F (36.9 C), Max:100.1 F (37.8 C)  Recent Labs  Lab 08/03/22 1203 08/03/22 1836 08/03/22 2056  WBC 14.9*  --   --   CREATININE 1.12*  --   --   LATICACIDVEN  --  0.9 0.7    Estimated Creatinine Clearance: 28.2 mL/min (A) (by C-G formula based on SCr of 1.12 mg/dL (H)).    Allergies  Allergen Reactions   Lisinopril Anaphylaxis    Antimicrobials this admission: 2/1 Zosyn >>   Dose adjustments this admission:  Microbiology results:  Thank you for allowing pharmacy to be a part of this patient's care.  Netta Cedars PharmD 08/04/2022 12:49 AM

## 2022-08-05 DIAGNOSIS — E43 Unspecified severe protein-calorie malnutrition: Secondary | ICD-10-CM | POA: Diagnosis not present

## 2022-08-05 DIAGNOSIS — I10 Essential (primary) hypertension: Secondary | ICD-10-CM | POA: Diagnosis not present

## 2022-08-05 DIAGNOSIS — I700 Atherosclerosis of aorta: Secondary | ICD-10-CM

## 2022-08-05 DIAGNOSIS — K529 Noninfective gastroenteritis and colitis, unspecified: Secondary | ICD-10-CM | POA: Diagnosis not present

## 2022-08-05 LAB — CBC
HCT: 41.7 % (ref 36.0–46.0)
Hemoglobin: 13.1 g/dL (ref 12.0–15.0)
MCH: 31.3 pg (ref 26.0–34.0)
MCHC: 31.4 g/dL (ref 30.0–36.0)
MCV: 99.8 fL (ref 80.0–100.0)
Platelets: 216 10*3/uL (ref 150–400)
RBC: 4.18 MIL/uL (ref 3.87–5.11)
RDW: 14.3 % (ref 11.5–15.5)
WBC: 9.1 10*3/uL (ref 4.0–10.5)
nRBC: 0 % (ref 0.0–0.2)

## 2022-08-05 LAB — LIPID PANEL
Cholesterol: 160 mg/dL (ref 0–200)
HDL: 23 mg/dL — ABNORMAL LOW (ref >40)
LDL Cholesterol: 108 mg/dL — ABNORMAL HIGH (ref 0–99)
Total CHOL/HDL Ratio: 7 RATIO
Triglycerides: 147 mg/dL (ref <150)
VLDL: 29 mg/dL (ref 0–40)

## 2022-08-05 NOTE — Progress Notes (Signed)
PROGRESS NOTE    Leisl Spurrier  KGM:[id number] DOB: December 04, 1950 DOA: 08/03/2022 PCP: Jenel Lucks, PA-C   Brief Narrative: Mary Bradshaw is a 72 y.o. female with a history of hypertension, nephrolithiasis, tobacco use, angioedema secondary to ACEi. Patient presented secondary to abdominal pain with associated fever and leukocytosis and found to have enterocolitis with concern for possible perforation and early abscess. Empiric antibiotics started. General surgery consulted with recommendations for conservative management. NPO.   Assessment and Plan:  Sepsis Present on admission. Secondary to enterocolitis. No blood cultures obtained on admission. Empiric Zosyn started. -Obtain blood cultures (post-antibiotics)  Enterocolitis Presumed infectious; positive for Norovirus. Empiric Zosyn started. Patient with sick contact. GI pathogen panel ordered. CT with evidence of associated colonic wall thickening involving the descending/sigmoid colon and rectum with associated possible perforation vs developing abscess. General surgery consulted. -Continue Zosyn -General surgery recommendations: Clear liquid diet, GI pathogen panel pending, continue conservative management -Morphine IV PRN for pain  Bladder wall thickening No symptoms. Possibly related to enterocolitis. Urine culture ordered after antibiotics. -Follow-up urine culture  Primary hypertension Patient is on amlodipine as an outpatient which was held on admission.  CAD Aortic atherosclerosis Noted on CT scan. LDL of 108. -Will recommend statin therapy on discharge   DVT prophylaxis: SCDs Code Status:   Code Status: Full Code Family Communication: None at bedside Disposition Plan: Discharge home likely in 1-3 days pending improvement of abdominal pain and general surgery recommendations/management   Consultants:  General surgery  Procedures:  None  Antimicrobials: Zosyn IV    Subjective: Patient  reports improvement in abdominal pain today compared to yesterday. No diarrhea today. No nausea/vomiting.  Objective: BP (!) 143/79 (BP Location: Right Arm)   Pulse 93   Temp 99.4 F (37.4 C)   Resp 18   Ht 5' (1.524 m)   Wt 39.9 kg   LMP 10/25/1995   SpO2 99%   BMI 17.18 kg/m   Examination:  General exam: Appears calm and comfortable Respiratory system: Clear to auscultation. Respiratory effort normal. Cardiovascular system: S1 & S2 heard, RRR. Gastrointestinal system: Abdomen is nondistended, soft and nontender. Decreased bowel sounds heard. Central nervous system: Alert and oriented. No focal neurological deficits. Musculoskeletal: No edema. No calf tenderness Skin: No cyanosis. No rashes Psychiatry: Judgement and insight appear normal. Mood & affect appropriate.    Data Reviewed: I have personally reviewed following labs and imaging studies  CBC Lab Results  Component Value Date   WBC 9.1 08/05/2022   RBC 4.18 08/05/2022   HGB 13.1 08/05/2022   HCT 41.7 08/05/2022   MCV 99.8 08/05/2022   MCH 31.3 08/05/2022   PLT 216 08/05/2022   MCHC 31.4 08/05/2022   RDW 14.3 08/05/2022   LYMPHSABS 2.8 02/03/2022   MONOABS 0.5 02/03/2022   EOSABS 0.0 02/03/2022   BASOSABS 0.0 64/40/3474     Last metabolic panel Lab Results  Component Value Date   NA 135 08/04/2022   K 3.3 (L) 08/04/2022   CL 102 08/04/2022   CO2 19 (L) 08/04/2022   BUN 21 08/04/2022   CREATININE 0.85 08/04/2022   GLUCOSE 74 08/04/2022   GFRNONAA >60 08/04/2022   GFRAA >60 04/11/2019   CALCIUM 8.8 (L) 08/04/2022   PROT 8.0 08/03/2022   ALBUMIN 3.5 08/03/2022   BILITOT 0.7 08/03/2022   ALKPHOS 72 08/03/2022   AST 18 08/03/2022   ALT 10 08/03/2022   ANIONGAP 14 08/04/2022    GFR: Estimated Creatinine Clearance: 37.1 mL/min (by C-G  formula based on SCr of 0.85 mg/dL).  Recent Results (from the past 240 hour(s))  Resp panel by RT-PCR (RSV, Flu A&B, Covid) Anterior Nasal Swab     Status: None    Collection Time: 08/03/22  3:40 PM   Specimen: Anterior Nasal Swab  Result Value Ref Range Status   SARS Coronavirus 2 by RT PCR NEGATIVE NEGATIVE Final    Comment: (NOTE) SARS-CoV-2 target nucleic acids are NOT DETECTED.  The SARS-CoV-2 RNA is generally detectable in upper respiratory specimens during the acute phase of infection. The lowest concentration of SARS-CoV-2 viral copies this assay can detect is 138 copies/mL. A negative result does not preclude SARS-Cov-2 infection and should not be used as the sole basis for treatment or other patient management decisions. A negative result may occur with  improper specimen collection/handling, submission of specimen other than nasopharyngeal swab, presence of viral mutation(s) within the areas targeted by this assay, and inadequate number of viral copies(<138 copies/mL). A negative result must be combined with clinical observations, patient history, and epidemiological information. The expected result is Negative.  Fact Sheet for Patients:  EntrepreneurPulse.com.au  Fact Sheet for Healthcare Providers:  IncredibleEmployment.be  This test is no t yet approved or cleared by the [place] FDA and  has been authorized for detection and/or diagnosis of SARS-CoV-2 by FDA under an Emergency Use Authorization (EUA). This EUA will remain  in effect (meaning this test can be used) for the duration of the COVID-19 declaration under Section 564(b)(1) of the Act, 21 U.S.C.section 360bbb-3(b)(1), unless the authorization is terminated  or revoked sooner.       Influenza A by PCR NEGATIVE NEGATIVE Final   Influenza B by PCR NEGATIVE NEGATIVE Final    Comment: (NOTE) The Xpert Xpress SARS-CoV-2/FLU/RSV plus assay is intended as an aid in the diagnosis of influenza from Nasopharyngeal swab specimens and should not be used as a sole basis for treatment. Nasal washings and aspirates are unacceptable for  Xpert Xpress SARS-CoV-2/FLU/RSV testing.  Fact Sheet for Patients: EntrepreneurPulse.com.au  Fact Sheet for Healthcare Providers: IncredibleEmployment.be  This test is not yet approved or cleared by the [place] FDA and has been authorized for detection and/or diagnosis of SARS-CoV-2 by FDA under an Emergency Use Authorization (EUA). This EUA will remain in effect (meaning this test can be used) for the duration of the COVID-19 declaration under Section 564(b)(1) of the Act, 21 U.S.C. section 360bbb-3(b)(1), unless the authorization is terminated or revoked.     Resp Syncytial Virus by PCR NEGATIVE NEGATIVE Final    Comment: (NOTE) Fact Sheet for Patients: EntrepreneurPulse.com.au  Fact Sheet for Healthcare Providers: IncredibleEmployment.be  This test is not yet approved or cleared by the [place] FDA and has been authorized for detection and/or diagnosis of SARS-CoV-2 by FDA under an Emergency Use Authorization (EUA). This EUA will remain in effect (meaning this test can be used) for the duration of the COVID-19 declaration under Section 564(b)(1) of the Act, 21 U.S.C. section 360bbb-3(b)(1), unless the authorization is terminated or revoked.  Performed at [facility], [address] [place], [place] [address]   Culture, blood (Routine X 2) w Reflex to ID Panel     Status: None (Preliminary result)   Collection Time: 08/04/22  8:46 AM   Specimen: BLOOD  Result Value Ref Range Status   Specimen Description   Final    BLOOD BLOOD LEFT HAND Performed at [facility] [address] [place], [place]  [address]    Special Requests   Final    BOTTLES DRAWN AEROBIC AND ANAEROBIC Blood Culture adequate volume Performed at [facility] [address] [place], [place] [address]    Culture   Final    NO GROWTH < 24 HOURS Performed at [facility] [address], [place], [place] [address]    Report Status PENDING  Incomplete  Culture, blood (Routine X 2) w Reflex to ID Panel     Status: None (Preliminary result)   Collection Time: 08/04/22  8:47 AM   Specimen: BLOOD  Result Value Ref Range Status   Specimen Description   Final    BLOOD BLOOD RIGHT HAND Performed at [facility] [address], [place], [place] [address]    Special Requests   Final    BOTTLES DRAWN AEROBIC AND ANAEROBIC Blood Culture adequate volume Performed at [facility] [address] [place], [place] [address]    Culture   Final    NO GROWTH < 24 HOURS Performed at [facility] [address], [place], [place] [address]    Report Status PENDING  Incomplete  Gastrointestinal Panel by PCR , Stool     Status: Abnormal   Collection Time: 08/04/22 12:25 PM   Specimen: Stool  Result Value Ref Range Status   Campylobacter species NOT DETECTED NOT DETECTED Final   Plesimonas shigelloides NOT DETECTED NOT DETECTED Final   Salmonella species NOT DETECTED NOT DETECTED Final   Yersinia enterocolitica NOT DETECTED NOT DETECTED Final   Vibrio species NOT DETECTED NOT DETECTED Final   Vibrio cholerae NOT DETECTED NOT DETECTED Final   Enteroaggregative E coli (EAEC) NOT DETECTED NOT DETECTED Final   Enteropathogenic E coli (EPEC) NOT DETECTED NOT DETECTED Final   Enterotoxigenic E coli (ETEC) NOT DETECTED NOT DETECTED Final   Shiga like toxin producing E coli (STEC) NOT DETECTED NOT DETECTED Final   Shigella/Enteroinvasive E coli (EIEC) NOT DETECTED NOT DETECTED Final   Cryptosporidium NOT DETECTED NOT DETECTED Final   Cyclospora cayetanensis NOT DETECTED NOT DETECTED Final   Entamoeba histolytica NOT DETECTED NOT DETECTED Final   Giardia lamblia NOT DETECTED NOT DETECTED Final   Adenovirus F40/41 NOT DETECTED NOT DETECTED Final   Astrovirus NOT DETECTED NOT DETECTED Final   Norovirus GI/GII DETECTED (A)  NOT DETECTED Final    Comment: RESULT CALLED TO, READ BACK BY AND VERIFIED WITH: OLIVIA CORBETT @ 2122 ON 08/04/2022 BY CAF    Rotavirus A NOT DETECTED NOT DETECTED Final   Sapovirus (I, II, IV, and V) NOT DETECTED NOT DETECTED Final    Comment: Performed at [facility], [address], [place], [place] [address]      Radiology Studies: CT ABDOMEN PELVIS WO CONTRAST  Result Date: 08/04/2022 CLINICAL DATA:  Abdominal pain and constipation. Nausea and diarrhea. EXAM: CT ABDOMEN AND PELVIS WITHOUT CONTRAST TECHNIQUE: Multidetector CT imaging of the abdomen and pelvis was performed following the standard protocol without IV contrast. RADIATION DOSE REDUCTION: This exam was performed according to the departmental dose-optimization program which includes automated exposure control, adjustment of the mA and/or kV according to patient size and/or use of iterative reconstruction technique. COMPARISON:  08/03/2022. FINDINGS: Lower chest: Scattered coronary artery calcifications are noted. A fat containing Bochdalek deck hernia is present on the right. Mild atelectasis at the lung bases. Hepatobiliary: No focal liver abnormality is seen. No gallstones, gallbladder wall thickening, or biliary dilatation. Pancreas: Unremarkable. No  pancreatic ductal dilatation or surrounding inflammatory changes. Spleen: Normal in size without focal abnormality. Adrenals/Urinary Tract: The adrenal glands are within normal limits. Renal calculi are noted bilaterally. No hydronephrosis bilaterally. There is diffuse bladder wall thickening. Stomach/Bowel: There is gastric wall thickening at the fundus and gastric antrum. No bowel obstruction or pneumatosis. Scattered diverticula are present along the colon. Marked inflammatory changes are present in the pelvis with small-bowel and colonic and rectal wall thickening. There is a focal air pockets in the pelvis on the right with surrounding soft tissue density which does not  opacify with contrast, best seen on axial image 61. Tiny foci of air are noted in the pelvis adjacent to the sigmoid colon and rectum.The possibility of walled-off perforation or developing abscess can not be excluded. A normal appendix is seen in the right lower quadrant. Vascular/Lymphatic: Aortic atherosclerosis and extensive atherosclerotic calcification of the common and femoral iliac arteries. No enlarged abdominal or pelvic lymph nodes. Reproductive: Status post hysterectomy. No adnexal masses. Other: Small amount of free fluid in the pelvis. Fat containing inguinal hernias are present bilaterally. Small fat containing umbilical hernia. Musculoskeletal: Degenerative changes in the thoracolumbar spine. No acute or suspicious osseous abnormality. IMPRESSION: 1. Colonic wall thickening involving the descending and sigmoid colon and rectum, compatible with colitis. 2. There are significant inflammatory changes in the pelvis with focal pockets of air in the pelvis on the right and surrounding soft tissue density which does not opacify with contrast, best seen on axial image 61. Possibility of walled-off perforation or developing abscess can not be excluded, unchanged from the prior exam. 3. Tiny foci of air are noted in the pelvis which may represent air in scattered diverticula versus contained perforations, not significantly changed from the prior exam. 4. Diffuse bladder wall thickening, which may be related to local inflammatory changes in the pelvis. 5. Coronary artery calcifications and aortic atherosclerosis. 6. Bilateral nephrolithiasis. Electronically Signed   By: Thornell Sartorius M.D.   On: 08/04/2022 03:24   DG Chest Port 1 View  Result Date: 08/03/2022 CLINICAL DATA:  cough and abdominal pain EXAM: PORTABLE CHEST 1 VIEW COMPARISON:  February 03, 2022 FINDINGS: The cardiomediastinal silhouette is unchanged in contour.Atherosclerotic calcifications of the aorta. No pleural effusion. No pneumothorax. No  acute pleuroparenchymal abnormality. Excreted renal contrast. IMPRESSION: No acute cardiopulmonary abnormality. Electronically Signed   By: Meda Klinefelter M.D.   On: 08/03/2022 17:22   CT Abdomen Pelvis W Contrast  Result Date: 08/03/2022 CLINICAL DATA:  Bowel obstruction suspected Abdominal pain, acute, nonlocalized EXAM: CT ABDOMEN AND PELVIS WITH CONTRAST TECHNIQUE: Multidetector CT imaging of the abdomen and pelvis was performed using the standard protocol following bolus administration of intravenous contrast. RADIATION DOSE REDUCTION: This exam was performed according to the departmental dose-optimization program which includes automated exposure control, adjustment of the mA and/or kV according to patient size and/or use of iterative reconstruction technique. CONTRAST:  26mL OMNIPAQUE IOHEXOL 300 MG/ML  SOLN COMPARISON:  May 03, 2022 FINDINGS: Lower chest: No acute abnormality. Hepatobiliary: Focal fatty deposition adjacent to the falciform ligament. Gallbladder is unremarkable. No extrahepatic biliary ductal dilation Pancreas: Unremarkable. No pancreatic ductal dilatation or surrounding inflammatory changes. Spleen: Normal in size without focal abnormality. Adrenals/Urinary Tract: Adrenal glands are unremarkable. Nonobstructing bilateral nephrolithiasis. Kidneys enhance symmetrically. No definitive obstructing nephrolithiasis are visualized. Bladder wall is mildly prominent for degree of decompression. Stomach/Bowel: There is circumferential wall thickening of the rectum, sigmoid colon and extending into the descending colon. Within the pelvis there are  also several loops small bowel with thickened walls and submucosal enhancement. And the RIGHT lateral aspect of the pelvis, there are at least 2 peripherally enhancing crescentic fluid and air containing areas which are not definitively intraluminal in location. Each spans approximately 3 cm (series 5, image 30; series 6, image 39). Along the  posterior sigmoid colon, there are several feculent foci which may be in diverticuli but are not definitive; these are of lower suspicion. (Series 2, image 52; series 2, image 57). Patient is status post appendectomy. Vascular/Lymphatic: Severe atherosclerotic calcifications of the aorta. 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 Reproductive: Surgically absent. Other: Small fat containing LEFT inguinal hernia. Musculoskeletal: Levocurvature of the lumbar spine. IMPRESSION: 1. There are diffuse focal inflammatory changes of the loops of large and small bowel located within the pelvis. These findings are favored to reflect enterocolitis. The etiology of this pelvic centered inflammation is unclear. However there are at least 2 peripherally enhancing crescentic fluid and air containing collections within the RIGHT lateral aspect of the pelvis. These are not definitively intraluminal in location and could reflect contained perforations or abscesses. This could be better assessed with dedicated CT abdomen pelvis with oral or rectal contrast to better delineate the bowel loops if further imaging is desired. 2. Bladder wall is mildly prominent for degree of decompression. This could be reactive due to pelvic inflammation. Recommend correlation with urinalysis to exclude cystitis. Aortic Atherosclerosis (ICD10-I70.0). These results were called by telephone at the time of interpretation on 08/03/2022 at 5:15 pm to provider Vanetta Mulders , who verbally acknowledged these results. Electronically Signed   By: Meda Klinefelter M.D.   On: 08/03/2022 17:21      LOS: 2 days    Jacquelin Hawking, MD [facility] 08/05/2022, 10:26 AM   If 7PM-7AM, please contact night-coverage [email or web address]

## 2022-08-05 NOTE — Progress Notes (Signed)
Subjective/Chief Complaint: Patient reports more diarrhea, but less abdominal pain On Zosyn Afebrile WBC decreasing   Objective: Vital signs in last 24 hours: Temp:  [97.8 F (36.6 C)-99.4 F (37.4 C)] 99.4 F (37.4 C) (02/03 0507) Pulse Rate:  [90-96] 93 (02/03 0507) Resp:  [16-27] 18 (02/03 0507) BP: (133-158)/(74-79) 143/79 (02/03 0507) SpO2:  [97 %-99 %] 99 % (02/03 0507) Last BM Date : 08/04/22  Intake/Output from previous day: 02/02 0701 - 02/03 0700 In: 1624.2 [I.V.:1363.4; IV Piggyback:260.7] Out: 600 [Urine:600] Intake/Output this shift: No intake/output data recorded.  WDWN in NAD Abd - soft, non-distended; minimal suprapubic tenderness; no guarding  Lab Results:  Recent Labs    08/03/22 1203 08/04/22 0353  WBC 14.9* 12.5*  HGB 14.7 13.5  HCT 43.7 41.6  PLT 222 193   BMET Recent Labs    08/03/22 1203 08/04/22 0353  NA 137 135  K 3.8 3.3*  CL 101 102  CO2 23 19*  GLUCOSE 117* 74  BUN 37* 21  CREATININE 1.12* 0.85  CALCIUM 9.4 8.8*   PT/INR No results for input(s): "LABPROT", "INR" in the last 72 hours. ABG No results for input(s): "PHART", "HCO3" in the last 72 hours.  Invalid input(s): "PCO2", "PO2"  Studies/Results: CT ABDOMEN PELVIS WO CONTRAST  Result Date: 08/04/2022 CLINICAL DATA:  Abdominal pain and constipation. Nausea and diarrhea. EXAM: CT ABDOMEN AND PELVIS WITHOUT CONTRAST TECHNIQUE: Multidetector CT imaging of the abdomen and pelvis was performed following the standard protocol without IV contrast. RADIATION DOSE REDUCTION: This exam was performed according to the departmental dose-optimization program which includes automated exposure control, adjustment of the mA and/or kV according to patient size and/or use of iterative reconstruction technique. COMPARISON:  08/03/2022. FINDINGS: Lower chest: Scattered coronary artery calcifications are noted. A fat containing Bochdalek deck hernia is present on the right. Mild atelectasis  at the lung bases. Hepatobiliary: No focal liver abnormality is seen. No gallstones, gallbladder wall thickening, or biliary dilatation. Pancreas: Unremarkable. No pancreatic ductal dilatation or surrounding inflammatory changes. Spleen: Normal in size without focal abnormality. Adrenals/Urinary Tract: The adrenal glands are within normal limits. Renal calculi are noted bilaterally. No hydronephrosis bilaterally. There is diffuse bladder wall thickening. Stomach/Bowel: There is gastric wall thickening at the fundus and gastric antrum. No bowel obstruction or pneumatosis. Scattered diverticula are present along the colon. Marked inflammatory changes are present in the pelvis with small-bowel and colonic and rectal wall thickening. There is a focal air pockets in the pelvis on the right with surrounding soft tissue density which does not opacify with contrast, best seen on axial image 61. Tiny foci of air are noted in the pelvis adjacent to the sigmoid colon and rectum.The possibility of walled-off perforation or developing abscess can not be excluded. A normal appendix is seen in the right lower quadrant. Vascular/Lymphatic: Aortic atherosclerosis and extensive atherosclerotic calcification of the common and femoral iliac arteries. No enlarged abdominal or pelvic lymph nodes. Reproductive: Status post hysterectomy. No adnexal masses. Other: Small amount of free fluid in the pelvis. Fat containing inguinal hernias are present bilaterally. Small fat containing umbilical hernia. Musculoskeletal: Degenerative changes in the thoracolumbar spine. No acute or suspicious osseous abnormality. IMPRESSION: 1. Colonic wall thickening involving the descending and sigmoid colon and rectum, compatible with colitis. 2. There are significant inflammatory changes in the pelvis with focal pockets of air in the pelvis on the right and surrounding soft tissue density which does not opacify with contrast, best seen on axial image 61.  Possibility of walled-off perforation or developing abscess can not be excluded, unchanged from the prior exam. 3. Tiny foci of air are noted in the pelvis which may represent air in scattered diverticula versus contained perforations, not significantly changed from the prior exam. 4. Diffuse bladder wall thickening, which may be related to local inflammatory changes in the pelvis. 5. Coronary artery calcifications and aortic atherosclerosis. 6. Bilateral nephrolithiasis. Electronically Signed   By: Brett Fairy M.D.   On: 08/04/2022 03:24   DG Chest Port 1 View  Result Date: 08/03/2022 CLINICAL DATA:  cough and abdominal pain EXAM: PORTABLE CHEST 1 VIEW COMPARISON:  February 03, 2022 FINDINGS: The cardiomediastinal silhouette is unchanged in contour.Atherosclerotic calcifications of the aorta. No pleural effusion. No pneumothorax. No acute pleuroparenchymal abnormality. Excreted renal contrast. IMPRESSION: No acute cardiopulmonary abnormality. Electronically Signed   By: Valentino Saxon M.D.   On: 08/03/2022 17:22   CT Abdomen Pelvis W Contrast  Result Date: 08/03/2022 CLINICAL DATA:  Bowel obstruction suspected Abdominal pain, acute, nonlocalized EXAM: CT ABDOMEN AND PELVIS WITH CONTRAST TECHNIQUE: Multidetector CT imaging of the abdomen and pelvis was performed using the standard protocol following bolus administration of intravenous contrast. RADIATION DOSE REDUCTION: This exam was performed according to the departmental dose-optimization program which includes automated exposure control, adjustment of the mA and/or kV according to patient size and/or use of iterative reconstruction technique. CONTRAST:  15mL OMNIPAQUE IOHEXOL 300 MG/ML  SOLN COMPARISON:  May 03, 2022 FINDINGS: Lower chest: No acute abnormality. Hepatobiliary: Focal fatty deposition adjacent to the falciform ligament. Gallbladder is unremarkable. No extrahepatic biliary ductal dilation Pancreas: Unremarkable. No pancreatic ductal  dilatation or surrounding inflammatory changes. Spleen: Normal in size without focal abnormality. Adrenals/Urinary Tract: Adrenal glands are unremarkable. Nonobstructing bilateral nephrolithiasis. Kidneys enhance symmetrically. No definitive obstructing nephrolithiasis are visualized. Bladder wall is mildly prominent for degree of decompression. Stomach/Bowel: There is circumferential wall thickening of the rectum, sigmoid colon and extending into the descending colon. Within the pelvis there are also several loops small bowel with thickened walls and submucosal enhancement. And the RIGHT lateral aspect of the pelvis, there are at least 2 peripherally enhancing crescentic fluid and air containing areas which are not definitively intraluminal in location. Each spans approximately 3 cm (series 5, image 30; series 6, image 39). Along the posterior sigmoid colon, there are several feculent foci which may be in diverticuli but are not definitive; these are of lower suspicion. (Series 2, image 52; series 2, image 57). Patient is status post appendectomy. Vascular/Lymphatic: Severe atherosclerotic calcifications of the aorta. 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 Reproductive: Surgically absent. Other: Small fat containing LEFT inguinal hernia. Musculoskeletal: Levocurvature of the lumbar spine. IMPRESSION: 1. There are diffuse focal inflammatory changes of the loops of large and small bowel located within the pelvis. These findings are favored to reflect enterocolitis. The etiology of this pelvic centered inflammation is unclear. However there are at least 2 peripherally enhancing crescentic fluid and air containing collections within the RIGHT lateral aspect of the pelvis. These are not definitively intraluminal in location and could reflect contained perforations or abscesses. This could be better assessed with dedicated CT abdomen pelvis with oral or rectal contrast to better delineate the bowel  loops if further imaging is desired. 2. Bladder wall is mildly prominent for degree of decompression. This could be reactive due to pelvic inflammation. Recommend correlation with urinalysis to exclude cystitis. Aortic Atherosclerosis (ICD10-I70.0). These results were called by telephone at the time of interpretation on  08/03/2022 at 5:15 pm to provider Fredia Sorrow , who verbally acknowledged these results. Electronically Signed   By: Valentino Saxon M.D.   On: 08/03/2022 17:21    Anti-infectives: Anti-infectives (From admission, onward)    Start     Dose/Rate Route Frequency Ordered Stop   08/04/22 0200  piperacillin-tazobactam (ZOSYN) IVPB 3.375 g        3.375 g 12.5 mL/hr over 240 Minutes Intravenous Every 8 hours 08/04/22 0029     08/03/22 1745  piperacillin-tazobactam (ZOSYN) IVPB 3.375 g        3.375 g 100 mL/hr over 30 Minutes Intravenous  Once 08/03/22 1741 08/03/22 1843       Assessment/Plan: Colitis with possible contained perforation and developing abscess Patient seems to be improving with conservative measures.  Less tender, decreased WBC   FEN - clear liquids (DO NOT ADVANCE), IVF per [facility] VTE - SCDs, okay for chem ppx from a general surgery standpoint ID - Zosyn Foley - None    LOS: 2 days    Mary Bradshaw 08/05/2022

## 2022-08-06 DIAGNOSIS — K529 Noninfective gastroenteritis and colitis, unspecified: Secondary | ICD-10-CM | POA: Diagnosis not present

## 2022-08-06 DIAGNOSIS — I10 Essential (primary) hypertension: Secondary | ICD-10-CM | POA: Diagnosis not present

## 2022-08-06 DIAGNOSIS — E43 Unspecified severe protein-calorie malnutrition: Secondary | ICD-10-CM | POA: Diagnosis not present

## 2022-08-06 DIAGNOSIS — I700 Atherosclerosis of aorta: Secondary | ICD-10-CM | POA: Diagnosis not present

## 2022-08-06 NOTE — Progress Notes (Signed)
Mobility Specialist - Progress Note   08/06/22 1442  Mobility  Activity Ambulated independently in hallway  Level of Assistance Independent  Assistive Device None  Distance Ambulated (ft) 350 ft  Activity Response Tolerated well  Mobility Referral Yes  $Mobility charge 1 Mobility   Pt received in bed and agreeable to mobility. No complaints during session. Pt to bed after session with all needs met.   [facility]

## 2022-08-06 NOTE — Progress Notes (Signed)
   Subjective/Chief Complaint: Diarrhea is slowing down Less abdominal pain Remains on Zosyn Afebrile   Objective: Vital signs in last 24 hours: Temp:  [98.5 F (36.9 C)-99.6 F (37.6 C)] 98.6 F (37 C) (02/04 4431) Pulse Rate:  [76-93] 78 (02/04 0608) Resp:  [16-20] 20 (02/04 5400) BP: (133-138)/(72-82) 138/77 (02/04 0608) SpO2:  [99 %-100 %] 99 % (02/04 0608) Last BM Date : 08/05/22  Intake/Output from previous day: 02/03 0701 - 02/04 0700 In: 251.8 [IV Piggyback:251.8] Out: 420 [Urine:420] Intake/Output this shift: Total I/O In: 3.2 [IV Piggyback:3.2] Out: 200 [Urine:200]  WDWN in NAD Abd - soft, non-distended; minimal suprapubic tenderness; no guarding   Lab Results:  Recent Labs    08/04/22 0353 08/05/22 0935  WBC 12.5* 9.1  HGB 13.5 13.1  HCT 41.6 41.7  PLT 193 216   BMET Recent Labs    08/03/22 1203 08/04/22 0353  NA 137 135  K 3.8 3.3*  CL 101 102  CO2 23 19*  GLUCOSE 117* 74  BUN 37* 21  CREATININE 1.12* 0.85  CALCIUM 9.4 8.8*   PT/INR No results for input(s): "LABPROT", "INR" in the last 72 hours. ABG No results for input(s): "PHART", "HCO3" in the last 72 hours.  Invalid input(s): "PCO2", "PO2"  Studies/Results: No results found.  Anti-infectives: Anti-infectives (From admission, onward)    Start     Dose/Rate Route Frequency Ordered Stop   08/04/22 0200  piperacillin-tazobactam (ZOSYN) IVPB 3.375 g        3.375 g 12.5 mL/hr over 240 Minutes Intravenous Every 8 hours 08/04/22 0029     08/03/22 1745  piperacillin-tazobactam (ZOSYN) IVPB 3.375 g        3.375 g 100 mL/hr over 30 Minutes Intravenous  Once 08/03/22 1741 08/03/22 1843       Assessment/Plan: Norovirus Colitis with possible contained perforation and developing abscess Patient seems to be improving with conservative measures.  Less tender, decreased WBC   FEN - clear liquids (DO NOT ADVANCE), IVF per TRH VTE - SCDs, okay for chem ppx from a general surgery  standpoint ID - Zosyn Foley - None  LOS: 3 days    Maia Petties 08/06/2022

## 2022-08-06 NOTE — Progress Notes (Addendum)
PROGRESS NOTE    Mary Bradshaw  HAL:[id number] DOB: October 20, 1950 DOA: 08/03/2022 PCP: Jenel Lucks, PA-C   Brief Narrative: Mary Bradshaw is a 72 y.o. female with a history of hypertension, nephrolithiasis, tobacco use, angioedema secondary to ACEi. Patient presented secondary to abdominal pain with associated fever and leukocytosis and found to have enterocolitis with concern for possible perforation and early abscess. Empiric antibiotics started. General surgery consulted with recommendations for conservative management. NPO.   Assessment and Plan:  Sepsis Present on admission. Secondary to enterocolitis. No blood cultures obtained on admission. Empiric Zosyn started. -Obtain blood cultures (post-antibiotics)  Enterocolitis Presumed infectious; positive for Norovirus. Empiric Zosyn started. Patient with sick contact. GI pathogen panel ordered. CT with evidence of associated colonic wall thickening involving the descending/sigmoid colon and rectum with associated possible perforation vs developing abscess. General surgery consulted. GI pathogen panel is significant for norovirus infection. -Continue Zosyn -General surgery recommendations: Full liquid diet, GI pathogen panel pending, continue conservative management -Morphine IV PRN for pain  Bladder wall thickening No symptoms. Possibly related to enterocolitis. Urine culture ordered after antibiotics. -Follow-up urine culture  Primary hypertension Patient is on amlodipine as an outpatient which was held on admission.  CAD Aortic atherosclerosis Noted on CT scan. LDL of 108. -Will recommend statin therapy on discharge  Severe malnutrition Dietitian recommendation (2/2): Advance diet as medically appropriate. Recommend Boost Plus po TID once diet advanced, each supplement provides 360 kcal and 14 grams of protein Encourage intake as tolerated.  Recommend daily multivitamin to support micronutrient needs.   Monitor weight trends.    DVT prophylaxis: SCDs Code Status:   Code Status: Full Code Family Communication: None at bedside Disposition Plan: Discharge home likely in 1-2 days pending improvement of abdominal pain and general surgery recommendations/management   Consultants:  General surgery  Procedures:  None  Antimicrobials: Zosyn IV    Subjective: Abdominal pain is improving.   Objective: BP (!) 158/86 (BP Location: Right Arm)   Pulse 91   Temp 98.6 F (37 C) (Oral)   Resp 20   Ht 5' (1.524 m)   Wt 39.9 kg   LMP 10/25/1995   SpO2 96%   BMI 17.18 kg/m   Examination:  General exam: Appears calm and comfortable Respiratory system: Clear to auscultation. Respiratory effort normal. Cardiovascular system: S1 & S2 heard, RRR. Gastrointestinal system: Abdomen is nondistended, soft and with mild rebound tenderness.  Normal bowel sounds heard. Central nervous system: Alert and oriented. No focal neurological deficits. Musculoskeletal: No edema. No calf tenderness Skin: No cyanosis. No rashes Psychiatry: Judgement and insight appear normal. Mood & affect appropriate.    Data Reviewed: I have personally reviewed following labs and imaging studies  CBC Lab Results  Component Value Date   WBC 9.1 08/05/2022   RBC 4.18 08/05/2022   HGB 13.1 08/05/2022   HCT 41.7 08/05/2022   MCV 99.8 08/05/2022   MCH 31.3 08/05/2022   PLT 216 08/05/2022   MCHC 31.4 08/05/2022   RDW 14.3 08/05/2022   LYMPHSABS 2.8 02/03/2022   MONOABS 0.5 02/03/2022   EOSABS 0.0 02/03/2022   BASOSABS 0.0 73/53/2992     Last metabolic panel Lab Results  Component Value Date   NA 135 08/04/2022   K 3.3 (L) 08/04/2022   CL 102 08/04/2022   CO2 19 (L) 08/04/2022   BUN 21 08/04/2022   CREATININE 0.85 08/04/2022   GLUCOSE 74 08/04/2022   GFRNONAA >60 08/04/2022   GFRAA >60 04/11/2019   CALCIUM  8.8 (L) 08/04/2022   PROT 8.0 08/03/2022   ALBUMIN 3.5 08/03/2022   BILITOT 0.7 08/03/2022    ALKPHOS 72 08/03/2022   AST 18 08/03/2022   ALT 10 08/03/2022   ANIONGAP 14 08/04/2022    GFR: Estimated Creatinine Clearance: 37.1 mL/min (by C-G formula based on SCr of 0.85 mg/dL).  Recent Results (from the past 240 hour(s))  Resp panel by RT-PCR (RSV, Flu A&B, Covid) Anterior Nasal Swab     Status: None   Collection Time: 08/03/22  3:40 PM   Specimen: Anterior Nasal Swab  Result Value Ref Range Status   SARS Coronavirus 2 by RT PCR NEGATIVE NEGATIVE Final    Comment: (NOTE) SARS-CoV-2 target nucleic acids are NOT DETECTED.  The SARS-CoV-2 RNA is generally detectable in upper respiratory specimens during the acute phase of infection. The lowest concentration of SARS-CoV-2 viral copies this assay can detect is 138 copies/mL. A negative result does not preclude SARS-Cov-2 infection and should not be used as the sole basis for treatment or other patient management decisions. A negative result may occur with  improper specimen collection/handling, submission of specimen other than nasopharyngeal swab, presence of viral mutation(s) within the areas targeted by this assay, and inadequate number of viral copies(<138 copies/mL). A negative result must be combined with clinical observations, patient history, and epidemiological information. The expected result is Negative.  Fact Sheet for Patients:  EntrepreneurPulse.com.au  Fact Sheet for Healthcare Providers:  IncredibleEmployment.be  This test is no t yet approved or cleared by the [place] FDA and  has been authorized for detection and/or diagnosis of SARS-CoV-2 by FDA under an Emergency Use Authorization (EUA). This EUA will remain  in effect (meaning this test can be used) for the duration of the COVID-19 declaration under Section 564(b)(1) of the Act, 21 U.S.C.section 360bbb-3(b)(1), unless the authorization is terminated  or revoked sooner.       Influenza A by PCR NEGATIVE  NEGATIVE Final   Influenza B by PCR NEGATIVE NEGATIVE Final    Comment: (NOTE) The Xpert Xpress SARS-CoV-2/FLU/RSV plus assay is intended as an aid in the diagnosis of influenza from Nasopharyngeal swab specimens and should not be used as a sole basis for treatment. Nasal washings and aspirates are unacceptable for Xpert Xpress SARS-CoV-2/FLU/RSV testing.  Fact Sheet for Patients: EntrepreneurPulse.com.au  Fact Sheet for Healthcare Providers: IncredibleEmployment.be  This test is not yet approved or cleared by the [place] FDA and has been authorized for detection and/or diagnosis of SARS-CoV-2 by FDA under an Emergency Use Authorization (EUA). This EUA will remain in effect (meaning this test can be used) for the duration of the COVID-19 declaration under Section 564(b)(1) of the Act, 21 U.S.C. section 360bbb-3(b)(1), unless the authorization is terminated or revoked.     Resp Syncytial Virus by PCR NEGATIVE NEGATIVE Final    Comment: (NOTE) Fact Sheet for Patients: EntrepreneurPulse.com.au  Fact Sheet for Healthcare Providers: IncredibleEmployment.be  This test is not yet approved or cleared by the [place] FDA and has been authorized for detection and/or diagnosis of SARS-CoV-2 by FDA under an Emergency Use Authorization (EUA). This EUA will remain in effect (meaning this test can be used) for the duration of the COVID-19 declaration under Section 564(b)(1) of the Act, 21 U.S.C. section 360bbb-3(b)(1), unless the authorization is terminated or revoked.  Performed at [facility], [address] [place], [place] [address]   Culture, blood (Routine X 2) w Reflex to ID Panel  Status: None (Preliminary result)   Collection Time: 08/04/22  8:46 AM   Specimen: BLOOD  Result Value Ref Range Status   Specimen Description   Final    BLOOD BLOOD LEFT HAND Performed at [facility] [address] [place], [place] [address]    Special Requests   Final    BOTTLES DRAWN AEROBIC AND ANAEROBIC Blood Culture adequate volume Performed at [facility] [address], [place], [place] [address]    Culture   Final    NO GROWTH 2 DAYS Performed at [facility] [address], [place], [place] [address]    Report Status PENDING  Incomplete  Culture, blood (Routine X 2) w Reflex to ID Panel     Status: None (Preliminary result)   Collection Time: 08/04/22  8:47 AM   Specimen: BLOOD  Result Value Ref Range Status   Specimen Description   Final    BLOOD BLOOD RIGHT HAND Performed at [facility] [address], [place], [place] [address]    Special Requests   Final    BOTTLES DRAWN AEROBIC AND ANAEROBIC Blood Culture adequate volume Performed at [facility] [address], [place], [place] [address]    Culture   Final    NO GROWTH 2 DAYS Performed at [facility] [address] [place], [place] [address]    Report Status PENDING  Incomplete  Gastrointestinal Panel by PCR , Stool     Status: Abnormal   Collection Time: 08/04/22 12:25 PM   Specimen: Stool  Result Value Ref Range Status   Campylobacter species NOT DETECTED NOT DETECTED Final   Plesimonas shigelloides NOT DETECTED NOT DETECTED Final   Salmonella species NOT DETECTED NOT DETECTED Final   Yersinia enterocolitica NOT DETECTED NOT DETECTED Final   Vibrio species NOT DETECTED NOT DETECTED Final   Vibrio cholerae NOT DETECTED NOT DETECTED Final   Enteroaggregative E coli (EAEC) NOT DETECTED NOT DETECTED Final   Enteropathogenic E coli (EPEC) NOT DETECTED NOT DETECTED Final   Enterotoxigenic E coli (ETEC) NOT DETECTED NOT DETECTED Final   Shiga like toxin producing E coli (STEC) NOT DETECTED NOT DETECTED Final   Shigella/Enteroinvasive E coli (EIEC) NOT DETECTED NOT DETECTED Final   Cryptosporidium NOT DETECTED  NOT DETECTED Final   Cyclospora cayetanensis NOT DETECTED NOT DETECTED Final   Entamoeba histolytica NOT DETECTED NOT DETECTED Final   Giardia lamblia NOT DETECTED NOT DETECTED Final   Adenovirus F40/41 NOT DETECTED NOT DETECTED Final   Astrovirus NOT DETECTED NOT DETECTED Final   Norovirus GI/GII DETECTED (A) NOT DETECTED Final    Comment: RESULT CALLED TO, READ BACK BY AND VERIFIED WITH: OLIVIA CORBETT @ 2122 ON 08/04/2022 BY CAF    Rotavirus A NOT DETECTED NOT DETECTED Final   Sapovirus (I, II, IV, and V) NOT DETECTED NOT DETECTED Final    Comment: Performed at [facility], [address] [place], [place] [address]      Radiology Studies: No results found.    LOS: 3 days    Cordelia Poche, MD [facility] 08/06/2022, 12:57 PM   If 7PM-7AM, please contact night-coverage [email or web address]

## 2022-08-07 DIAGNOSIS — I10 Essential (primary) hypertension: Secondary | ICD-10-CM | POA: Diagnosis not present

## 2022-08-07 DIAGNOSIS — I700 Atherosclerosis of aorta: Secondary | ICD-10-CM | POA: Diagnosis not present

## 2022-08-07 DIAGNOSIS — E43 Unspecified severe protein-calorie malnutrition: Secondary | ICD-10-CM | POA: Diagnosis not present

## 2022-08-07 DIAGNOSIS — K529 Noninfective gastroenteritis and colitis, unspecified: Secondary | ICD-10-CM | POA: Diagnosis not present

## 2022-08-07 LAB — BASIC METABOLIC PANEL
Anion gap: 9 (ref 5–15)
BUN: 5 mg/dL — ABNORMAL LOW (ref 8–23)
CO2: 26 mmol/L (ref 22–32)
Calcium: 9.1 mg/dL (ref 8.9–10.3)
Chloride: 102 mmol/L (ref 98–111)
Creatinine, Ser: 0.61 mg/dL (ref 0.44–1.00)
GFR, Estimated: >60 mL/min (ref >60)
Glucose, Bld: 106 mg/dL — ABNORMAL HIGH (ref 70–99)
Potassium: 3.6 mmol/L (ref 3.5–5.1)
Sodium: 137 mmol/L (ref 135–145)

## 2022-08-07 LAB — CBC
HCT: 36.6 % (ref 36.0–46.0)
Hemoglobin: 12 g/dL (ref 12.0–15.0)
MCH: 30.9 pg (ref 26.0–34.0)
MCHC: 32.8 g/dL (ref 30.0–36.0)
MCV: 94.3 fL (ref 80.0–100.0)
Platelets: 251 10*3/uL (ref 150–400)
RBC: 3.88 MIL/uL (ref 3.87–5.11)
RDW: 14 % (ref 11.5–15.5)
WBC: 7.7 10*3/uL (ref 4.0–10.5)
nRBC: 0 % (ref 0.0–0.2)

## 2022-08-07 MED ORDER — BOOST PLUS PO LIQD
237.0000 mL | Freq: Three times a day (TID) | ORAL | Status: DC
  Administered 2022-08-07 – 2022-08-08 (×3): 237 mL via ORAL
  Filled 2022-08-07 (×5): qty 237

## 2022-08-07 MED ORDER — AMOXICILLIN-POT CLAVULANATE 875-125 MG PO TABS
1.0000 | ORAL_TABLET | Freq: Two times a day (BID) | ORAL | Status: DC
  Administered 2022-08-07 – 2022-08-08 (×3): 1 via ORAL
  Filled 2022-08-07 (×3): qty 1

## 2022-08-07 MED ORDER — HYDROCODONE-ACETAMINOPHEN 5-325 MG PO TABS
1.0000 | ORAL_TABLET | ORAL | Status: DC | PRN
  Administered 2022-08-07 – 2022-08-08 (×2): 1 via ORAL
  Filled 2022-08-07 (×2): qty 1

## 2022-08-07 MED ORDER — ADULT MULTIVITAMIN W/MINERALS CH
1.0000 | ORAL_TABLET | Freq: Every day | ORAL | Status: DC
  Administered 2022-08-07 – 2022-08-08 (×2): 1 via ORAL
  Filled 2022-08-07 (×2): qty 1

## 2022-08-07 NOTE — Progress Notes (Addendum)
Mobility Specialist - Progress Note   08/07/22 1126  Mobility  Activity Ambulated independently in hallway  Level of Assistance Independent  Assistive Device None  Distance Ambulated (ft) 350 ft  Activity Response Tolerated well  Mobility Referral Yes  $Mobility charge 1 Mobility   Pt received in bed and agreeable to mobility. No complaints during session. Upon returning to room pt had a coughing spell but stated she was okay. Pt to bed after session with all needs met. Bed alarm turned back on.  [facility]

## 2022-08-07 NOTE — Progress Notes (Signed)
PROGRESS NOTE    Mary Bradshaw  PIR:[id number] DOB: Sep 18, 1950 DOA: 08/03/2022 PCP: Jenel Lucks, PA-C   Brief Narrative: Mary Bradshaw is a 72 y.o. female with a history of hypertension, nephrolithiasis, tobacco use, angioedema secondary to ACEi. Patient presented secondary to abdominal pain with associated fever and leukocytosis and found to have enterocolitis with concern for possible perforation and early abscess. Empiric antibiotics started. General surgery consulted with recommendations for conservative management. NPO.   Assessment and Plan:  Sepsis Present on admission. Secondary to enterocolitis. No blood cultures obtained on admission. Empiric Zosyn started. Blood cultures with no growth. GI pathogen panel significant for Norovirus.  Enterocolitis Presumed infectious; positive for Norovirus. Empiric Zosyn started. Patient with sick contact. GI pathogen panel ordered. CT with evidence of associated colonic wall thickening involving the descending/sigmoid colon and rectum with associated possible perforation vs developing abscess. General surgery consulted. GI pathogen panel is significant for norovirus infection. -Continue Zosyn -General surgery recommendations: Soft diet. Continued antibiotics -Norco PO PRN for pain  Bladder wall thickening No symptoms. Possibly related to enterocolitis. Urine culture ordered after antibiotics but not obtained.  Primary hypertension Patient is on amlodipine as an outpatient which was held on admission.  CAD Aortic atherosclerosis Noted on CT scan. LDL of 108. -Will recommend statin therapy on discharge  Severe malnutrition Dietitian recommendation (2/2): Advance diet as medically appropriate. Recommend Boost Plus po TID once diet advanced, each supplement provides 360 kcal and 14 grams of protein Encourage intake as tolerated.  Recommend daily multivitamin to support micronutrient needs.  Monitor weight trends.     DVT prophylaxis: SCDs Code Status:   Code Status: Full Code Family Communication: None at bedside Disposition Plan: Discharge home likely in 1 day if tolerates a soft diet on oral analgesics   Consultants:  General surgery  Procedures:  None  Antimicrobials: Zosyn IV    Subjective: Continued improvement in abdominal pain. Still with some abdominal pain.  Objective: BP (!) 141/79 (BP Location: Left Arm)   Pulse 86   Temp 97.7 F (36.5 C)   Resp 19   Ht 5' (1.524 m)   Wt 39.9 kg   LMP 10/25/1995   SpO2 97%   BMI 17.18 kg/m   Examination:  General exam: Appears calm and comfortable Respiratory system: Clear to auscultation. Respiratory effort normal. Cardiovascular system: S1 & S2 heard, RRR. No murmurs, rubs, gallops or clicks. Gastrointestinal system: Abdomen is nondistended, soft and mildly tender in LLQ. Normal bowel sounds heard. Central nervous system: Alert and oriented. No focal neurological deficits. Musculoskeletal: No edema. No calf tenderness Skin: No cyanosis. No rashes Psychiatry: Judgement and insight appear normal. Mood & affect appropriate.    Data Reviewed: I have personally reviewed following labs and imaging studies  CBC Lab Results  Component Value Date   WBC 7.7 08/07/2022   RBC 3.88 08/07/2022   HGB 12.0 08/07/2022   HCT 36.6 08/07/2022   MCV 94.3 08/07/2022   MCH 30.9 08/07/2022   PLT 251 08/07/2022   MCHC 32.8 08/07/2022   RDW 14.0 08/07/2022   LYMPHSABS 2.8 02/03/2022   MONOABS 0.5 02/03/2022   EOSABS 0.0 02/03/2022   BASOSABS 0.0 63/07/6008     Last metabolic panel Lab Results  Component Value Date   NA 137 08/07/2022   K 3.6 08/07/2022   CL 102 08/07/2022   CO2 26 08/07/2022   BUN 5 (L) 08/07/2022   CREATININE 0.61 08/07/2022   GLUCOSE 106 (H) 08/07/2022   GFRNONAA >60  08/07/2022   GFRAA >60 04/11/2019   CALCIUM 9.1 08/07/2022   PROT 8.0 08/03/2022   ALBUMIN 3.5 08/03/2022   BILITOT 0.7 08/03/2022    ALKPHOS 72 08/03/2022   AST 18 08/03/2022   ALT 10 08/03/2022   ANIONGAP 9 08/07/2022    GFR: Estimated Creatinine Clearance: 39.4 mL/min (by C-G formula based on SCr of 0.61 mg/dL).  Recent Results (from the past 240 hour(s))  Resp panel by RT-PCR (RSV, Flu A&B, Covid) Anterior Nasal Swab     Status: None   Collection Time: 08/03/22  3:40 PM   Specimen: Anterior Nasal Swab  Result Value Ref Range Status   SARS Coronavirus 2 by RT PCR NEGATIVE NEGATIVE Final    Comment: (NOTE) SARS-CoV-2 target nucleic acids are NOT DETECTED.  The SARS-CoV-2 RNA is generally detectable in upper respiratory specimens during the acute phase of infection. The lowest concentration of SARS-CoV-2 viral copies this assay can detect is 138 copies/mL. A negative result does not preclude SARS-Cov-2 infection and should not be used as the sole basis for treatment or other patient management decisions. A negative result may occur with  improper specimen collection/handling, submission of specimen other than nasopharyngeal swab, presence of viral mutation(s) within the areas targeted by this assay, and inadequate number of viral copies(<138 copies/mL). A negative result must be combined with clinical observations, patient history, and epidemiological information. The expected result is Negative.  Fact Sheet for Patients:  EntrepreneurPulse.com.au  Fact Sheet for Healthcare Providers:  IncredibleEmployment.be  This test is no t yet approved or cleared by the [place] FDA and  has been authorized for detection and/or diagnosis of SARS-CoV-2 by FDA under an Emergency Use Authorization (EUA). This EUA will remain  in effect (meaning this test can be used) for the duration of the COVID-19 declaration under Section 564(b)(1) of the Act, 21 U.S.C.section 360bbb-3(b)(1), unless the authorization is terminated  or revoked sooner.       Influenza A by PCR NEGATIVE  NEGATIVE Final   Influenza B by PCR NEGATIVE NEGATIVE Final    Comment: (NOTE) The Xpert Xpress SARS-CoV-2/FLU/RSV plus assay is intended as an aid in the diagnosis of influenza from Nasopharyngeal swab specimens and should not be used as a sole basis for treatment. Nasal washings and aspirates are unacceptable for Xpert Xpress SARS-CoV-2/FLU/RSV testing.  Fact Sheet for Patients: EntrepreneurPulse.com.au  Fact Sheet for Healthcare Providers: IncredibleEmployment.be  This test is not yet approved or cleared by the [place] FDA and has been authorized for detection and/or diagnosis of SARS-CoV-2 by FDA under an Emergency Use Authorization (EUA). This EUA will remain in effect (meaning this test can be used) for the duration of the COVID-19 declaration under Section 564(b)(1) of the Act, 21 U.S.C. section 360bbb-3(b)(1), unless the authorization is terminated or revoked.     Resp Syncytial Virus by PCR NEGATIVE NEGATIVE Final    Comment: (NOTE) Fact Sheet for Patients: EntrepreneurPulse.com.au  Fact Sheet for Healthcare Providers: IncredibleEmployment.be  This test is not yet approved or cleared by the [place] FDA and has been authorized for detection and/or diagnosis of SARS-CoV-2 by FDA under an Emergency Use Authorization (EUA). This EUA will remain in effect (meaning this test can be used) for the duration of the COVID-19 declaration under Section 564(b)(1) of the Act, 21 U.S.C. section 360bbb-3(b)(1), unless the authorization is terminated or revoked.  Performed at [facility], [address] [place], [place] [address]   Culture, blood (Routine X 2)  w Reflex to ID Panel     Status: None (Preliminary result)   Collection Time: 08/04/22  8:46 AM   Specimen: BLOOD  Result Value Ref Range Status   Specimen Description   Final    BLOOD BLOOD LEFT HAND Performed at [facility] [address] [place], [place] [address]    Special Requests   Final    BOTTLES DRAWN AEROBIC AND ANAEROBIC Blood Culture adequate volume Performed at [facility] [address], [place], [place] [address]    Culture   Final    NO GROWTH 3 DAYS Performed at [facility], [place] [address] [place], [place] [address]    Report Status PENDING  Incomplete  Culture, blood (Routine X 2) w Reflex to ID Panel     Status: None (Preliminary result)   Collection Time: 08/04/22  8:47 AM   Specimen: BLOOD  Result Value Ref Range Status   Specimen Description   Final    BLOOD BLOOD RIGHT HAND Performed at [facility] [address], [place], [place] [address]    Special Requests   Final    BOTTLES DRAWN AEROBIC AND ANAEROBIC Blood Culture adequate volume Performed at [facility] [address] [place], [place] [address]    Culture   Final    NO GROWTH 3 DAYS Performed at [facility], [place] [address] [place],  [address]    Report Status PENDING  Incomplete  Gastrointestinal Panel by PCR , Stool     Status: Abnormal   Collection Time: 08/04/22 12:25 PM   Specimen: Stool  Result Value Ref Range Status   Campylobacter species NOT DETECTED NOT DETECTED Final   Plesimonas shigelloides NOT DETECTED NOT DETECTED Final   Salmonella species NOT DETECTED NOT DETECTED Final   Yersinia enterocolitica NOT DETECTED NOT DETECTED Final   Vibrio species NOT DETECTED NOT DETECTED Final   Vibrio cholerae NOT DETECTED NOT DETECTED Final   Enteroaggregative E coli (EAEC) NOT DETECTED NOT DETECTED Final   Enteropathogenic E coli (EPEC) NOT DETECTED NOT DETECTED Final   Enterotoxigenic E coli (ETEC) NOT DETECTED NOT DETECTED Final   Shiga like toxin producing E coli (STEC) NOT DETECTED NOT DETECTED Final   Shigella/Enteroinvasive E coli (EIEC) NOT DETECTED NOT DETECTED Final   Cryptosporidium NOT DETECTED  NOT DETECTED Final   Cyclospora cayetanensis NOT DETECTED NOT DETECTED Final   Entamoeba histolytica NOT DETECTED NOT DETECTED Final   Giardia lamblia NOT DETECTED NOT DETECTED Final   Adenovirus F40/41 NOT DETECTED NOT DETECTED Final   Astrovirus NOT DETECTED NOT DETECTED Final   Norovirus GI/GII DETECTED (A) NOT DETECTED Final    Comment: RESULT CALLED TO, READ BACK BY AND VERIFIED WITH: OLIVIA CORBETT @ 2122 ON 08/04/2022 BY CAF    Rotavirus A NOT DETECTED NOT DETECTED Final   Sapovirus (I, II, IV, and V) NOT DETECTED NOT DETECTED Final    Comment: Performed at [facility], [address] [place],  [address]      Radiology Studies: No results found.    LOS: 4 days    Cordelia Poche, MD [facility] 08/07/2022, 12:53 PM   If 7PM-7AM, please contact night-coverage [email or web address]

## 2022-08-07 NOTE — Plan of Care (Signed)

## 2022-08-07 NOTE — Progress Notes (Signed)
Mobility Specialist - Progress Note   08/07/22 1657  Mobility  Activity Ambulated independently in hallway  Level of Assistance Independent  Assistive Device None  Distance Ambulated (ft) 350 ft  Activity Response Tolerated well  Mobility Referral Yes  $Mobility charge 1 Mobility   Pt received in bed and agreeable to mobility. No complaints during session. Pt to bed after session with all needs met.    [facility]

## 2022-08-07 NOTE — Progress Notes (Signed)
   Subjective/Chief Complaint: Patient continues to have bowel movements Minimal abdominal pain Afebrile, Nl WBC   Objective: Vital signs in last 24 hours: Temp:  [97.7 F (36.5 C)-99.3 F (37.4 C)] 97.7 F (36.5 C) (02/05 0509) Pulse Rate:  [70-91] 86 (02/05 0509) Resp:  [19-20] 19 (02/05 0509) BP: (128-158)/(71-86) 141/79 (02/05 0509) SpO2:  [96 %-98 %] 97 % (02/05 0509) Last BM Date : 08/05/22  Intake/Output from previous day: 02/04 0701 - 02/05 0700 In: 875.7 [P.O.:720; IV Piggyback:155.7] Out: 500 [Urine:500] Intake/Output this shift: No intake/output data recorded.  WDWN in NAD Abd - soft, non-tender  Lab Results:  Recent Labs    08/05/22 0935 08/07/22 0352  WBC 9.1 7.7  HGB 13.1 12.0  HCT 41.7 36.6  PLT 216 251   BMET Recent Labs    08/07/22 0352  NA 137  K 3.6  CL 102  CO2 26  GLUCOSE 106*  BUN 5*  CREATININE 0.61  CALCIUM 9.1   PT/INR No results for input(s): "LABPROT", "INR" in the last 72 hours. ABG No results for input(s): "PHART", "HCO3" in the last 72 hours.  Invalid input(s): "PCO2", "PO2"  Studies/Results: No results found.  Anti-infectives: Anti-infectives (From admission, onward)    Start     Dose/Rate Route Frequency Ordered Stop   08/07/22 1000  amoxicillin-clavulanate (AUGMENTIN) 875-125 MG per tablet 1 tablet        1 tablet Oral Every 12 hours 08/07/22 0755     08/04/22 0200  piperacillin-tazobactam (ZOSYN) IVPB 3.375 g  Status:  Discontinued        3.375 g 12.5 mL/hr over 240 Minutes Intravenous Every 8 hours 08/04/22 0029 08/07/22 0755   08/03/22 1745  piperacillin-tazobactam (ZOSYN) IVPB 3.375 g        3.375 g 100 mL/hr over 30 Minutes Intravenous  Once 08/03/22 1741 08/03/22 1843       Assessment/Plan: Norovirus Colitis with possible contained perforation and developing abscess Patient continues to improve with conservative measures.  Less tender, decreased WBC   FEN - soft diet VTE - SCDs, okay for chem  ppx from a general surgery standpoint ID - Zosyn - converted to PO Augmentin Foley - None  Patient may be discharged home with an additional week of Augmentin if she is tolerating soft diet.  No acute surgical indications.  Follow-up in chart.  LOS: 4 days    Maia Petties 08/07/2022

## 2022-08-08 DIAGNOSIS — K529 Noninfective gastroenteritis and colitis, unspecified: Secondary | ICD-10-CM | POA: Diagnosis not present

## 2022-08-08 MED ORDER — ORAL CARE MOUTH RINSE: 15.0000 mL | OROMUCOSAL | Status: DC | PRN

## 2022-08-08 MED ORDER — ATORVASTATIN CALCIUM 40 MG PO TABS: 40.0000 mg | ORAL_TABLET | Freq: Every day | ORAL | 2 refills | Status: AC

## 2022-08-08 MED ORDER — BOOST PLUS PO LIQD: 237.0000 mL | Freq: Three times a day (TID) | ORAL | 0 refills | Status: AC

## 2022-08-08 MED ORDER — HYDROCODONE-ACETAMINOPHEN 5-325 MG PO TABS: 1.0000 | ORAL_TABLET | Freq: Four times a day (QID) | ORAL | 0 refills | Status: AC | PRN

## 2022-08-08 MED ORDER — AMOXICILLIN-POT CLAVULANATE 875-125 MG PO TABS: 1.0000 | ORAL_TABLET | Freq: Two times a day (BID) | ORAL | 0 refills | Status: AC

## 2022-08-08 NOTE — Plan of Care (Signed)

## 2022-08-08 NOTE — Discharge Instructions (Addendum)
Mary Bradshaw,  You were in the hospital with a colon infection. Part if this appears to have been related to a virus, but there also concern for bacteria involvement. You have improved with antibiotics. Please continue antibiotics as prescribed. You will need to follow-up with your PCP and the general surgeon.

## 2022-08-08 NOTE — Discharge Summary (Addendum)
Physician Discharge Summary   Patient: Mary Bradshaw MRN: [id number] DOB: 1951-04-22  Admit date:     08/03/2022  Discharge date: 08/08/22  Discharge Physician: Jacquelin Hawking, MD   PCP: Kerin Salen, PA-C   Recommendations at discharge:  PCP and general surgery follow-up Continue antibiotics x7 days post-discharge  Discharge Diagnoses: Principal Problem:   Enterocolitis Active Problems:   Sepsis (HCC)   Essential hypertension   Protein-calorie malnutrition, severe   CAD (coronary artery disease)   Aortic atherosclerosis (HCC)  Resolved Problems:   * No resolved hospital problems. *  Hospital Course: Mary Bradshaw is a 72 y.o. female with a history of hypertension, nephrolithiasis, tobacco use, angioedema secondary to ACEi. Patient presented secondary to abdominal pain with associated fever and leukocytosis and found to have enterocolitis with concern for possible perforation and early abscess. Empiric antibiotics started. General surgery consulted with recommendations for conservative management. NPO.  Assessment and Plan:  Sepsis Present on admission. Secondary to enterocolitis. No blood cultures obtained on admission. Empiric Zosyn started. Blood cultures with no growth. GI pathogen panel significant for Norovirus.   Enterocolitis Presumed infectious; positive for Norovirus. Empiric Zosyn started. Patient with sick contact. GI pathogen panel ordered. CT with evidence of associated colonic wall thickening involving the descending/sigmoid colon and rectum with associated possible perforation vs developing abscess. General surgery consulted. GI pathogen panel is significant for norovirus infection. Zosyn transitioned to Augmentin. General surgery recommends Augmentin 7 days post-discharge. Patient to follow-up with general surgery as an outpatient.   Bladder wall thickening No symptoms. Possibly related to enterocolitis. Urine culture ordered after antibiotics but not  obtained.   Primary hypertension Patient is on amlodipine as an outpatient which was held on admission.   CAD Aortic atherosclerosis Noted on CT scan. LDL of 108. Lipitor on discharge. -Will recommend statin therapy on discharge   Severe malnutrition Dietitian recommendation (2/2): Advance diet as medically appropriate. Recommend Boost Plus po TID once diet advanced, each supplement provides 360 kcal and 14 grams of protein Encourage intake as tolerated.  Recommend daily multivitamin to support micronutrient needs.  Monitor weight trends.    Consultants: General surgery Procedures performed: None  Disposition: Home Diet recommendation: Soft diet   DISCHARGE MEDICATION: Allergies as of 08/08/2022       Reactions   Lisinopril Anaphylaxis        Medication List     TAKE these medications    albuterol 108 (90 Base) MCG/ACT inhaler Commonly known as: VENTOLIN HFA Inhale 1-2 puffs into the lungs every 6 (six) hours as needed for wheezing.   amLODipine 10 MG tablet Commonly known as: NORVASC Take 1 tablet (10 mg total) by mouth daily.   amoxicillin-clavulanate 875-125 MG tablet Commonly known as: AUGMENTIN Take 1 tablet by mouth 2 (two) times daily for 7 days.   atorvastatin 40 MG tablet Commonly known as: Lipitor Take 1 tablet (40 mg total) by mouth daily.   fluticasone 50 MCG/ACT nasal spray Commonly known as: FLONASE Place 1 spray into both nostrils daily.   HYDROcodone-acetaminophen 5-325 MG tablet Commonly known as: NORCO/VICODIN Take 1 tablet by mouth every 6 (six) hours as needed for up to 5 days for moderate pain or severe pain.   lactose free nutrition Liqd Take 237 mLs by mouth 3 (three) times daily with meals.   nicotine 21 mg/24hr patch Commonly known as: NICODERM CQ - dosed in mg/24 hours Place 1 patch (21 mg total) onto the skin daily.  Follow-up Information     Kinsinger, De Blanch, MD Follow up in 3 week(s).   Specialty:  General Surgery Contact information: [address] [place] [address] [phone]         Kerin Salen, PA-C. Schedule an appointment as soon as possible for a visit in 1 week(s).   Specialty: Internal Medicine Why: For hospital follow-up Contact information: [address] [place] [address] [phone]                Discharge Exam: BP 138/80 (BP Location: Right Arm)   Pulse 94   Temp 98.1 F (36.7 C) (Oral)   Resp 18   Ht 5' (1.524 m)   Wt 39.9 kg   LMP 10/25/1995   SpO2 98%   BMI 17.18 kg/m   General exam: Appears calm and comfortable Respiratory system: Clear to auscultation. Respiratory effort normal. Cardiovascular system: S1 & S2 heard, RRR. No murmurs, rubs, gallops or clicks. Gastrointestinal system: Abdomen is nondistended, soft and mildly tender. No organomegaly or masses felt. Normal bowel sounds heard. Central nervous system: Alert and oriented. No focal neurological deficits. Musculoskeletal: No edema. No calf tenderness Skin: No cyanosis. No rashes Psychiatry: Judgement and insight appear normal. Mood & affect appropriate.   Condition at discharge: stable  The results of significant diagnostics from this hospitalization (including imaging, microbiology, ancillary and laboratory) are listed below for reference.   Imaging Studies: CT ABDOMEN PELVIS WO CONTRAST  Result Date: 08/04/2022 CLINICAL DATA:  Abdominal pain and constipation. Nausea and diarrhea. EXAM: CT ABDOMEN AND PELVIS WITHOUT CONTRAST TECHNIQUE: Multidetector CT imaging of the abdomen and pelvis was performed following the standard protocol without IV contrast. RADIATION DOSE REDUCTION: This exam was performed according to the departmental dose-optimization program which includes automated exposure control, adjustment of the mA and/or kV according to patient size and/or use of iterative reconstruction technique. COMPARISON:  08/03/2022. FINDINGS:  Lower chest: Scattered coronary artery calcifications are noted. A fat containing Bochdalek deck hernia is present on the right. Mild atelectasis at the lung bases. Hepatobiliary: No focal liver abnormality is seen. No gallstones, gallbladder wall thickening, or biliary dilatation. Pancreas: Unremarkable. No pancreatic ductal dilatation or surrounding inflammatory changes. Spleen: Normal in size without focal abnormality. Adrenals/Urinary Tract: The adrenal glands are within normal limits. Renal calculi are noted bilaterally. No hydronephrosis bilaterally. There is diffuse bladder wall thickening. Stomach/Bowel: There is gastric wall thickening at the fundus and gastric antrum. No bowel obstruction or pneumatosis. Scattered diverticula are present along the colon. Marked inflammatory changes are present in the pelvis with small-bowel and colonic and rectal wall thickening. There is a focal air pockets in the pelvis on the right with surrounding soft tissue density which does not opacify with contrast, best seen on axial image 61. Tiny foci of air are noted in the pelvis adjacent to the sigmoid colon and rectum.The possibility of walled-off perforation or developing abscess can not be excluded. A normal appendix is seen in the right lower quadrant. Vascular/Lymphatic: Aortic atherosclerosis and extensive atherosclerotic calcification of the common and femoral iliac arteries. No enlarged abdominal or pelvic lymph nodes. Reproductive: Status post hysterectomy. No adnexal masses. Other: Small amount of free fluid in the pelvis. Fat containing inguinal hernias are present bilaterally. Small fat containing umbilical hernia. Musculoskeletal: Degenerative changes in the thoracolumbar spine. No acute or suspicious osseous abnormality. IMPRESSION: 1. Colonic wall thickening involving the descending and sigmoid colon and rectum, compatible with colitis. 2. There are significant inflammatory  changes in the pelvis with focal  pockets of air in the pelvis on the right and surrounding soft tissue density which does not opacify with contrast, best seen on axial image 61. Possibility of walled-off perforation or developing abscess can not be excluded, unchanged from the prior exam. 3. Tiny foci of air are noted in the pelvis which may represent air in scattered diverticula versus contained perforations, not significantly changed from the prior exam. 4. Diffuse bladder wall thickening, which may be related to local inflammatory changes in the pelvis. 5. Coronary artery calcifications and aortic atherosclerosis. 6. Bilateral nephrolithiasis. Electronically Signed   By: Brett Fairy M.D.   On: 08/04/2022 03:24   DG Chest Port 1 View  Result Date: 08/03/2022 CLINICAL DATA:  cough and abdominal pain EXAM: PORTABLE CHEST 1 VIEW COMPARISON:  February 03, 2022 FINDINGS: The cardiomediastinal silhouette is unchanged in contour.Atherosclerotic calcifications of the aorta. No pleural effusion. No pneumothorax. No acute pleuroparenchymal abnormality. Excreted renal contrast. IMPRESSION: No acute cardiopulmonary abnormality. Electronically Signed   By: Valentino Saxon M.D.   On: 08/03/2022 17:22   CT Abdomen Pelvis W Contrast  Result Date: 08/03/2022 CLINICAL DATA:  Bowel obstruction suspected Abdominal pain, acute, nonlocalized EXAM: CT ABDOMEN AND PELVIS WITH CONTRAST TECHNIQUE: Multidetector CT imaging of the abdomen and pelvis was performed using the standard protocol following bolus administration of intravenous contrast. RADIATION DOSE REDUCTION: This exam was performed according to the departmental dose-optimization program which includes automated exposure control, adjustment of the mA and/or kV according to patient size and/or use of iterative reconstruction technique. CONTRAST:  16mL OMNIPAQUE IOHEXOL 300 MG/ML  SOLN COMPARISON:  May 03, 2022 FINDINGS: Lower chest: No acute abnormality. Hepatobiliary: Focal fatty deposition adjacent  to the falciform ligament. Gallbladder is unremarkable. No extrahepatic biliary ductal dilation Pancreas: Unremarkable. No pancreatic ductal dilatation or surrounding inflammatory changes. Spleen: Normal in size without focal abnormality. Adrenals/Urinary Tract: Adrenal glands are unremarkable. Nonobstructing bilateral nephrolithiasis. Kidneys enhance symmetrically. No definitive obstructing nephrolithiasis are visualized. Bladder wall is mildly prominent for degree of decompression. Stomach/Bowel: There is circumferential wall thickening of the rectum, sigmoid colon and extending into the descending colon. Within the pelvis there are also several loops small bowel with thickened walls and submucosal enhancement. And the RIGHT lateral aspect of the pelvis, there are at least 2 peripherally enhancing crescentic fluid and air containing areas which are not definitively intraluminal in location. Each spans approximately 3 cm (series 5, image 30; series 6, image 39). Along the posterior sigmoid colon, there are several feculent foci which may be in diverticuli but are not definitive; these are of lower suspicion. (Series 2, image 52; series 2, image 57). Patient is status post appendectomy. Vascular/Lymphatic: Severe atherosclerotic calcifications of the aorta. 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 Reproductive: Surgically absent. Other: Small fat containing LEFT inguinal hernia. Musculoskeletal: Levocurvature of the lumbar spine. IMPRESSION: 1. There are diffuse focal inflammatory changes of the loops of large and small bowel located within the pelvis. These findings are favored to reflect enterocolitis. The etiology of this pelvic centered inflammation is unclear. However there are at least 2 peripherally enhancing crescentic fluid and air containing collections within the RIGHT lateral aspect of the pelvis. These are not definitively intraluminal in location and could reflect contained  perforations or abscesses. This could be better assessed with dedicated CT abdomen pelvis with oral or rectal contrast to better delineate the bowel loops if further imaging is desired. 2. Bladder wall is mildly prominent for  degree of decompression. This could be reactive due to pelvic inflammation. Recommend correlation with urinalysis to exclude cystitis. Aortic Atherosclerosis (ICD10-I70.0). These results were called by telephone at the time of interpretation on 08/03/2022 at 5:15 pm to provider Fredia Sorrow , who verbally acknowledged these results. Electronically Signed   By: Valentino Saxon M.D.   On: 08/03/2022 17:21    Microbiology: Results for orders placed or performed during the hospital encounter of 08/03/22  Resp panel by RT-PCR (RSV, Flu A&B, Covid) Anterior Nasal Swab     Status: None   Collection Time: 08/03/22  3:40 PM   Specimen: Anterior Nasal Swab  Result Value Ref Range Status   SARS Coronavirus 2 by RT PCR NEGATIVE NEGATIVE Final    Comment: (NOTE) SARS-CoV-2 target nucleic acids are NOT DETECTED.  The SARS-CoV-2 RNA is generally detectable in upper respiratory specimens during the acute phase of infection. The lowest concentration of SARS-CoV-2 viral copies this assay can detect is 138 copies/mL. A negative result does not preclude SARS-Cov-2 infection and should not be used as the sole basis for treatment or other patient management decisions. A negative result may occur with  improper specimen collection/handling, submission of specimen other than nasopharyngeal swab, presence of viral mutation(s) within the areas targeted by this assay, and inadequate number of viral copies(<138 copies/mL). A negative result must be combined with clinical observations, patient history, and epidemiological information. The expected result is Negative.  Fact Sheet for Patients:  EntrepreneurPulse.com.au  Fact Sheet for Healthcare Providers:   IncredibleEmployment.be  This test is no t yet approved or cleared by the [place] FDA and  has been authorized for detection and/or diagnosis of SARS-CoV-2 by FDA under an Emergency Use Authorization (EUA). This EUA will remain  in effect (meaning this test can be used) for the duration of the COVID-19 declaration under Section 564(b)(1) of the Act, 21 U.S.C.section 360bbb-3(b)(1), unless the authorization is terminated  or revoked sooner.       Influenza A by PCR NEGATIVE NEGATIVE Final   Influenza B by PCR NEGATIVE NEGATIVE Final    Comment: (NOTE) The Xpert Xpress SARS-CoV-2/FLU/RSV plus assay is intended as an aid in the diagnosis of influenza from Nasopharyngeal swab specimens and should not be used as a sole basis for treatment. Nasal washings and aspirates are unacceptable for Xpert Xpress SARS-CoV-2/FLU/RSV testing.  Fact Sheet for Patients: EntrepreneurPulse.com.au  Fact Sheet for Healthcare Providers: IncredibleEmployment.be  This test is not yet approved or cleared by the [place] FDA and has been authorized for detection and/or diagnosis of SARS-CoV-2 by FDA under an Emergency Use Authorization (EUA). This EUA will remain in effect (meaning this test can be used) for the duration of the COVID-19 declaration under Section 564(b)(1) of the Act, 21 U.S.C. section 360bbb-3(b)(1), unless the authorization is terminated or revoked.     Resp Syncytial Virus by PCR NEGATIVE NEGATIVE Final    Comment: (NOTE) Fact Sheet for Patients: EntrepreneurPulse.com.au  Fact Sheet for Healthcare Providers: IncredibleEmployment.be  This test is not yet approved or cleared by the [place] FDA and has been authorized for detection and/or diagnosis of SARS-CoV-2 by FDA under an Emergency Use Authorization (EUA). This EUA will remain in effect (meaning this test can be used) for  the duration of the COVID-19 declaration under Section 564(b)(1) of the Act, 21 U.S.C. section 360bbb-3(b)(1), unless the authorization is terminated or revoked.  Performed at [facility], [address] [place], [place] [address]  Culture, blood (Routine X 2) w Reflex to ID Panel     Status: None (Preliminary result)   Collection Time: 08/04/22  8:46 AM   Specimen: BLOOD  Result Value Ref Range Status   Specimen Description   Final    BLOOD BLOOD LEFT HAND Performed at [facility] [address] [place], [place] [address]    Special Requests   Final    BOTTLES DRAWN AEROBIC AND ANAEROBIC Blood Culture adequate volume Performed at [facility] [address] [place], [place] [address]    Culture   Final    NO GROWTH 4 DAYS Performed at [facility], [place] [address] [place], [place] [address]    Report Status PENDING  Incomplete  Culture, blood (Routine X 2) w Reflex to ID Panel     Status: None (Preliminary result)   Collection Time: 08/04/22  8:47 AM   Specimen: BLOOD  Result Value Ref Range Status   Specimen Description   Final    BLOOD BLOOD RIGHT HAND Performed at [facility] [address] [place], [place] [address]    Special Requests   Final    BOTTLES DRAWN AEROBIC AND ANAEROBIC Blood Culture adequate volume Performed at [facility] [address] [place], [place] [address]    Culture   Final    NO GROWTH 4 DAYS Performed at [facility], [place] [address] [place], [place] [address]    Report Status PENDING  Incomplete  Gastrointestinal Panel by PCR , Stool     Status: Abnormal   Collection Time: 08/04/22 12:25 PM   Specimen: Stool  Result Value Ref Range Status   Campylobacter species NOT DETECTED NOT DETECTED Final   Plesimonas shigelloides NOT DETECTED NOT DETECTED Final   Salmonella species NOT DETECTED NOT DETECTED Final   Yersinia enterocolitica  NOT DETECTED NOT DETECTED Final   Vibrio species NOT DETECTED NOT DETECTED Final   Vibrio cholerae NOT DETECTED NOT DETECTED Final   Enteroaggregative E coli (EAEC) NOT DETECTED NOT DETECTED Final   Enteropathogenic E coli (EPEC) NOT DETECTED NOT DETECTED Final   Enterotoxigenic E coli (ETEC) NOT DETECTED NOT DETECTED Final   Shiga like toxin producing E coli (STEC) NOT DETECTED NOT DETECTED Final   Shigella/Enteroinvasive E coli (EIEC) NOT DETECTED NOT DETECTED Final   Cryptosporidium NOT DETECTED NOT DETECTED Final   Cyclospora cayetanensis NOT DETECTED NOT DETECTED Final   Entamoeba histolytica NOT DETECTED NOT DETECTED Final   Giardia lamblia NOT DETECTED NOT DETECTED Final   Adenovirus F40/41 NOT DETECTED NOT DETECTED Final   Astrovirus NOT DETECTED NOT DETECTED Final   Norovirus GI/GII DETECTED (A) NOT DETECTED Final    Comment: RESULT CALLED TO, READ BACK BY AND VERIFIED WITH: OLIVIA CORBETT @ 2122 ON 08/04/2022 BY CAF    Rotavirus A NOT DETECTED NOT DETECTED Final   Sapovirus (I, II, IV, and V) NOT DETECTED NOT DETECTED Final    Comment: Performed at [facility], [address] [place], [place] [address]    Labs: CBC: Recent Labs  Lab 08/03/22 1203 08/04/22 0353 08/05/22 0935 08/07/22 0352  WBC 14.9* 12.5* 9.1 7.7  HGB 14.7 13.5 13.1 12.0  HCT 43.7 41.6 41.7 36.6  MCV 92.8 97.4 99.8 94.3  PLT 222 193 216 094   Basic Metabolic Panel: Recent Labs  Lab 08/03/22 1203 08/04/22 0353 08/07/22 0352  NA 137 135 137  K 3.8 3.3* 3.6  CL 101 102 102  CO2 23 19* 26  GLUCOSE 117* 74 106*  BUN 37* 21 5*  CREATININE 1.12* 0.85 0.61  CALCIUM 9.4 8.8* 9.1   Liver Function Tests: Recent Labs  Lab 08/03/22 1203  AST 18  ALT 10  ALKPHOS 72  BILITOT 0.7  PROT 8.0  ALBUMIN 3.5    Discharge time spent: 35 minutes.  Signed: Jacquelin Hawking, MD [facility] 08/08/2022

## 2022-08-08 NOTE — Progress Notes (Signed)
.   Transition of Care [facility]) Screening Note   Patient Details  Name: Mary Bradshaw Date of Birth: [birth year]   [facility]) CM/SW Contact:    Illene Regulus, LCSW Phone Number: 08/08/2022, 2:05 PM    Transition of Care Department [facility]) has reviewed patient and no TOC needs have been identified at this time. We will continue to monitor patient advancement through interdisciplinary progression rounds. If new patient transition needs arise, please place a TOC consult.

## 2022-08-08 NOTE — Progress Notes (Signed)
Subjective: CC: No abdominal pain at rest. Some abdominal pain when she has a bm. Has not required IV pain medication since yesterday morning. Had a hamburger for dinner last night. Tolerated this well without n/v. Passing flatus. BM yesterday that was non-bloody.   Objective: Vital signs in last 24 hours: Temp:  [98.2 F (36.8 C)-98.9 F (37.2 C)] 98.2 F (36.8 C) (02/06 0420) Pulse Rate:  [77-98] 77 (02/06 0420) Resp:  [16-20] 16 (02/06 0420) BP: (132-146)/(72-81) 133/80 (02/06 0420) SpO2:  [94 %-98 %] 98 % (02/06 0420) Last BM Date : 08/07/22  Intake/Output from previous day: 02/05 0701 - 02/06 0700 In: 780 [P.O.:780] Out: 800 [Urine:800] Intake/Output this shift: No intake/output data recorded.  PE: Gen:  Alert, NAD, pleasant Abd: Soft, ND, NT, +BS  Lab Results:  Recent Labs    08/05/22 0935 08/07/22 0352  WBC 9.1 7.7  HGB 13.1 12.0  HCT 41.7 36.6  PLT 216 251   BMET Recent Labs    08/07/22 0352  NA 137  K 3.6  CL 102  CO2 26  GLUCOSE 106*  BUN 5*  CREATININE 0.61  CALCIUM 9.1   PT/INR No results for input(s): "LABPROT", "INR" in the last 72 hours. CMP     Component Value Date/Time   NA 137 08/07/2022 0352   K 3.6 08/07/2022 0352   CL 102 08/07/2022 0352   CO2 26 08/07/2022 0352   GLUCOSE 106 (H) 08/07/2022 0352   BUN 5 (L) 08/07/2022 0352   CREATININE 0.61 08/07/2022 0352   CALCIUM 9.1 08/07/2022 0352   PROT 8.0 08/03/2022 1203   ALBUMIN 3.5 08/03/2022 1203   AST 18 08/03/2022 1203   ALT 10 08/03/2022 1203   ALKPHOS 72 08/03/2022 1203   BILITOT 0.7 08/03/2022 1203   GFRNONAA >60 08/07/2022 0352   GFRAA >60 04/11/2019 1209   Lipase     Component Value Date/Time   LIPASE 21 08/03/2022 1203    Studies/Results: No results found.  Anti-infectives: Anti-infectives (From admission, onward)    Start     Dose/Rate Route Frequency Ordered Stop   08/07/22 1000  amoxicillin-clavulanate (AUGMENTIN) 875-125 MG per tablet 1 tablet         1 tablet Oral Every 12 hours 08/07/22 0755     08/04/22 0200  piperacillin-tazobactam (ZOSYN) IVPB 3.375 g  Status:  Discontinued        3.375 g 12.5 mL/hr over 240 Minutes Intravenous Every 8 hours 08/04/22 0029 08/07/22 0755   08/03/22 1745  piperacillin-tazobactam (ZOSYN) IVPB 3.375 g        3.375 g 100 mL/hr over 30 Minutes Intravenous  Once 08/03/22 1741 08/03/22 1843        Assessment/Plan Norovirus Colitis with possible contained perforation and developing abscess - Patient appears to have improved with conservative measures. She is tolerating diet without n/v, having bowel function and is NT on exam. Afebrile without tachycardia or hypotension. WBC normalized on last check (7.7 on 2/5). No indication for emergency surgery. Recommend additional week of Augmentin at d/c. We will sign off. Call back with any questions or concerns.    FEN - Soft diet VTE - SCDs, okay for chem ppx from a general surgery standpoint ID - Zosyn - converted to PO Augmentin Foley - None  I reviewed nursing notes, hospitalist notes, last 24 h vitals and pain scores, last 48 h intake and output, last 24 h labs and trends, and last 24 h imaging results.  LOS: 5 days    Jillyn Ledger , [facility] 08/08/2022, 9:06 AM Please see Amion for pager number during day hours 7:00am-4:30pm

## 2022-08-09 LAB — CULTURE, BLOOD (ROUTINE X 2)
Culture: NO GROWTH
Culture: NO GROWTH
Special Requests: ADEQUATE
Special Requests: ADEQUATE

## 2022-09-20 ENCOUNTER — Emergency Department (HOSPITAL_BASED_OUTPATIENT_CLINIC_OR_DEPARTMENT_OTHER)
Admission: EM | Admit: 2022-09-20 | Discharge: 2022-09-20 | Disposition: A | Discharge status: Home / Self Care | Payer: Medicare Other | Attending: Emergency Medicine | Admitting: Emergency Medicine

## 2022-09-20 ENCOUNTER — Encounter (HOSPITAL_BASED_OUTPATIENT_CLINIC_OR_DEPARTMENT_OTHER): Payer: Self-pay | Admitting: Emergency Medicine

## 2022-09-20 ENCOUNTER — Other Ambulatory Visit: Payer: Self-pay

## 2022-09-20 ENCOUNTER — Emergency Department (HOSPITAL_BASED_OUTPATIENT_CLINIC_OR_DEPARTMENT_OTHER): Payer: Medicare Other

## 2022-09-20 DIAGNOSIS — Z1152 Encounter for screening for COVID-19: Secondary | ICD-10-CM | POA: Insufficient documentation

## 2022-09-20 DIAGNOSIS — I10 Essential (primary) hypertension: Secondary | ICD-10-CM | POA: Insufficient documentation

## 2022-09-20 DIAGNOSIS — Z87891 Personal history of nicotine dependence: Secondary | ICD-10-CM | POA: Insufficient documentation

## 2022-09-20 DIAGNOSIS — J209 Acute bronchitis, unspecified: Secondary | ICD-10-CM

## 2022-09-20 DIAGNOSIS — Z79899 Other long term (current) drug therapy: Secondary | ICD-10-CM | POA: Diagnosis not present

## 2022-09-20 DIAGNOSIS — D72829 Elevated white blood cell count, unspecified: Secondary | ICD-10-CM | POA: Insufficient documentation

## 2022-09-20 DIAGNOSIS — R059 Cough, unspecified: Secondary | ICD-10-CM | POA: Diagnosis present

## 2022-09-20 LAB — CBC WITH DIFFERENTIAL/PLATELET
Abs Immature Granulocytes: 0.03 10*3/uL (ref 0.00–0.07)
Basophils Absolute: 0.1 10*3/uL (ref 0.0–0.1)
Basophils Relative: 1 %
Eosinophils Absolute: 0 10*3/uL (ref 0.0–0.5)
Eosinophils Relative: 0 %
HCT: 38.7 % (ref 36.0–46.0)
Hemoglobin: 12.7 g/dL (ref 12.0–15.0)
Immature Granulocytes: 0 %
Lymphocytes Relative: 18 %
Lymphs Abs: 2 10*3/uL (ref 0.7–4.0)
MCH: 31.6 pg (ref 26.0–34.0)
MCHC: 32.8 g/dL (ref 30.0–36.0)
MCV: 96.3 fL (ref 80.0–100.0)
Monocytes Absolute: 0.9 10*3/uL (ref 0.1–1.0)
Monocytes Relative: 8 %
Neutro Abs: 8.1 10*3/uL — ABNORMAL HIGH (ref 1.7–7.7)
Neutrophils Relative %: 73 %
Platelets: 282 10*3/uL (ref 150–400)
RBC: 4.02 MIL/uL (ref 3.87–5.11)
RDW: 15 % (ref 11.5–15.5)
WBC: 11.1 10*3/uL — ABNORMAL HIGH (ref 4.0–10.5)
nRBC: 0 % (ref 0.0–0.2)

## 2022-09-20 LAB — RESP PANEL BY RT-PCR (RSV, FLU A&B, COVID)  RVPGX2
Influenza A by PCR: NEGATIVE
Influenza B by PCR: NEGATIVE
Resp Syncytial Virus by PCR: NEGATIVE
SARS Coronavirus 2 by RT PCR: NEGATIVE

## 2022-09-20 LAB — BASIC METABOLIC PANEL
Anion gap: 7 (ref 5–15)
BUN: 12 mg/dL (ref 8–23)
CO2: 24 mmol/L (ref 22–32)
Calcium: 9.1 mg/dL (ref 8.9–10.3)
Chloride: 105 mmol/L (ref 98–111)
Creatinine, Ser: 0.73 mg/dL (ref 0.44–1.00)
GFR, Estimated: >60 mL/min (ref >60)
Glucose, Bld: 108 mg/dL — ABNORMAL HIGH (ref 70–99)
Potassium: 3.3 mmol/L — ABNORMAL LOW (ref 3.5–5.1)
Sodium: 136 mmol/L (ref 135–145)

## 2022-09-20 MED ORDER — ACETAMINOPHEN 325 MG PO TABS
650.0000 mg | ORAL_TABLET | Freq: Once | ORAL | Status: AC | PRN
  Administered 2022-09-20: 650 mg via ORAL
  Filled 2022-09-20: qty 2

## 2022-09-20 MED ORDER — DOXYCYCLINE HYCLATE 100 MG PO CAPS: 100.0000 mg | ORAL_CAPSULE | Freq: Two times a day (BID) | ORAL | 0 refills | Status: AC

## 2022-09-20 MED ORDER — PREDNISONE 10 MG (21) PO TBPK: ORAL_TABLET | ORAL | 0 refills | Status: AC

## 2022-09-20 NOTE — ED Provider Notes (Signed)
[facility]  Provider Note  CSN: [id number] Arrival date & time: 09/20/22 0151  History Chief Complaint  Patient presents with   Cough    Mary Bradshaw is a 72 y.o. female with history of HTN and tobacco abuse reports about a week of cough, productive of thick white sputum and congestion with intermittent fevers. She reports having chills tonight and worsening cough which prompted her ED visit. She denies feeling SOB. Does not usually have breathing problems such as COPD    Home Medications Prior to Admission medications   Medication Sig Start Date End Date Taking? Authorizing Provider  doxycycline (VIBRAMYCIN) 100 MG capsule Take 1 capsule (100 mg total) by mouth 2 (two) times daily. 09/20/22  Yes Truddie Hidden, MD  predniSONE (STERAPRED UNI-PAK 21 TAB) 10 MG (21) TBPK tablet 10mg  Tabs, 6 day taper. Use as directed 09/20/22  Yes Truddie Hidden, MD  albuterol (PROVENTIL HFA;VENTOLIN HFA) 108 [phone] Base) MCG/ACT inhaler Inhale 1-2 puffs into the lungs every 6 (six) hours as needed for wheezing. 07/13/17   Tanna Furry, MD  amLODipine (NORVASC) 10 MG tablet Take 1 tablet (10 mg total) by mouth daily. 02/05/22   Omar Person, NP  atorvastatin (LIPITOR) 40 MG tablet Take 1 tablet (40 mg total) by mouth daily. 08/08/22 11/06/22  Mariel Aloe, MD  fluticasone (FLONASE) 50 MCG/ACT nasal spray Place 1 spray into both nostrils daily. Patient not taking: Reported on 08/03/2022 02/05/22   Omar Person, NP  lactose free nutrition (BOOST PLUS) LIQD Take 237 mLs by mouth 3 (three) times daily with meals. 08/08/22   Mariel Aloe, MD  nicotine (NICODERM CQ - DOSED IN MG/24 HOURS) 21 mg/24hr patch Place 1 patch (21 mg total) onto the skin daily. Patient not taking: Reported on 08/03/2022 02/05/22   Omar Person, NP     Allergies    Lisinopril   Review of Systems   Review of Systems Please see HPI for pertinent positives and  negatives  Physical Exam BP (!) 164/99 (BP Location: Right Arm)   Pulse (!) 108   Temp (!) 101.8 F (38.8 C) (Oral)   Resp (!) 28   Ht 5' (1.524 m)   Wt 43.1 kg   LMP 10/25/1995   SpO2 96%   BMI 18.55 kg/m   Physical Exam Vitals and nursing note reviewed.  Constitutional:      Appearance: Normal appearance.  HENT:     Head: Normocephalic and atraumatic.     Nose: Nose normal.     Mouth/Throat:     Mouth: Mucous membranes are moist.  Eyes:     Extraocular Movements: Extraocular movements intact.     Conjunctiva/sclera: Conjunctivae normal.  Cardiovascular:     Rate and Rhythm: Normal rate.  Pulmonary:     Effort: Pulmonary effort is normal.     Breath sounds: Rhonchi present. No wheezing or rales.     Comments: Mild tachypnea Abdominal:     General: Abdomen is flat.     Palpations: Abdomen is soft.     Tenderness: There is no abdominal tenderness.  Musculoskeletal:        General: No swelling. Normal range of motion.     Cervical back: Neck supple.  Skin:    General: Skin is warm and dry.  Neurological:     General: No focal deficit present.     Mental Status: She is alert.  Psychiatric:  Mood and Affect: Mood normal.     ED Results / Procedures / Treatments   EKG None  Procedures Procedures  Medications Ordered in the ED Medications  acetaminophen (TYLENOL) tablet 650 mg (650 mg Oral Given 09/20/22 0207)    Initial Impression and Plan  Patient here with 1 week of cough and congestion, noted to be febrile on arrival. Suspect this is a viral process, so will hold off on full sepsis workup pending check of labs and CXR, Covid/Flu/RSV swab and reassessment of vitals after APAP.   ED Course   Clinical Course as of 09/20/22 0333  Wed Sep 20, 2022  0252 CBC with mild leukocytosis. Otherwise normal.  [CS]  [id number] I personally viewed the images from radiology studies and agree with radiologist interpretation: CXR is clear, no infiltrates or  consolidation [CS]  0258 BMP is normal. Covid/Flu/RSV swab is neg.  [CS]  [id number] Patient resting comfortable in bed. No distress, no hypoxia. Vitals are improved. Likely she has a bronchitis vs occult PNA. Will give a course of doxycycline, prednisone and close PCP follow up. RTED for any other concerns or worsening symptoms.  [CS]    Clinical Course User Index [CS] Truddie Hidden, MD     MDM Rules/Calculators/A&P Medical Decision Making Given presenting complaint, I considered that admission might be necessary. After review of results from ED lab and/or imaging studies, admission to the hospital is not indicated at this time.    Problems Addressed: Acute bronchitis, unspecified organism: acute illness or injury  Amount and/or Complexity of Data Reviewed Labs: ordered. Decision-making details documented in ED Course. Radiology: ordered and independent interpretation performed. Decision-making details documented in ED Course.  Risk OTC drugs. Prescription drug management. Decision regarding hospitalization.     Final Clinical Impression(s) / ED Diagnoses Final diagnoses:  Acute bronchitis, unspecified organism    Rx / DC Orders ED Discharge Orders          Ordered    doxycycline (VIBRAMYCIN) 100 MG capsule  2 times daily        09/20/22 0333    predniSONE (STERAPRED UNI-PAK 21 TAB) 10 MG (21) TBPK tablet        09/20/22 0333             Truddie Hidden, MD 09/20/22 [phone]

## 2022-09-20 NOTE — ED Triage Notes (Signed)
Cough and congestion X 1 week, with chills.  Was unable to control cough at home.

## 2022-11-01 IMAGING — DX DG CHEST 1V PORT
1 series · 1 of 1 positions shown · non-contrast
Comparison: Portable exam 6716 hours compared to 07/14/2020

CLINICAL DATA: RIGHT flank pain for 1 week, history of kidney
stones, RIGHT shoulder pain for many months coughing in triage,
COPD, lifelong smoker

EXAM:
PORTABLE CHEST 1 VIEW

[chest ap]
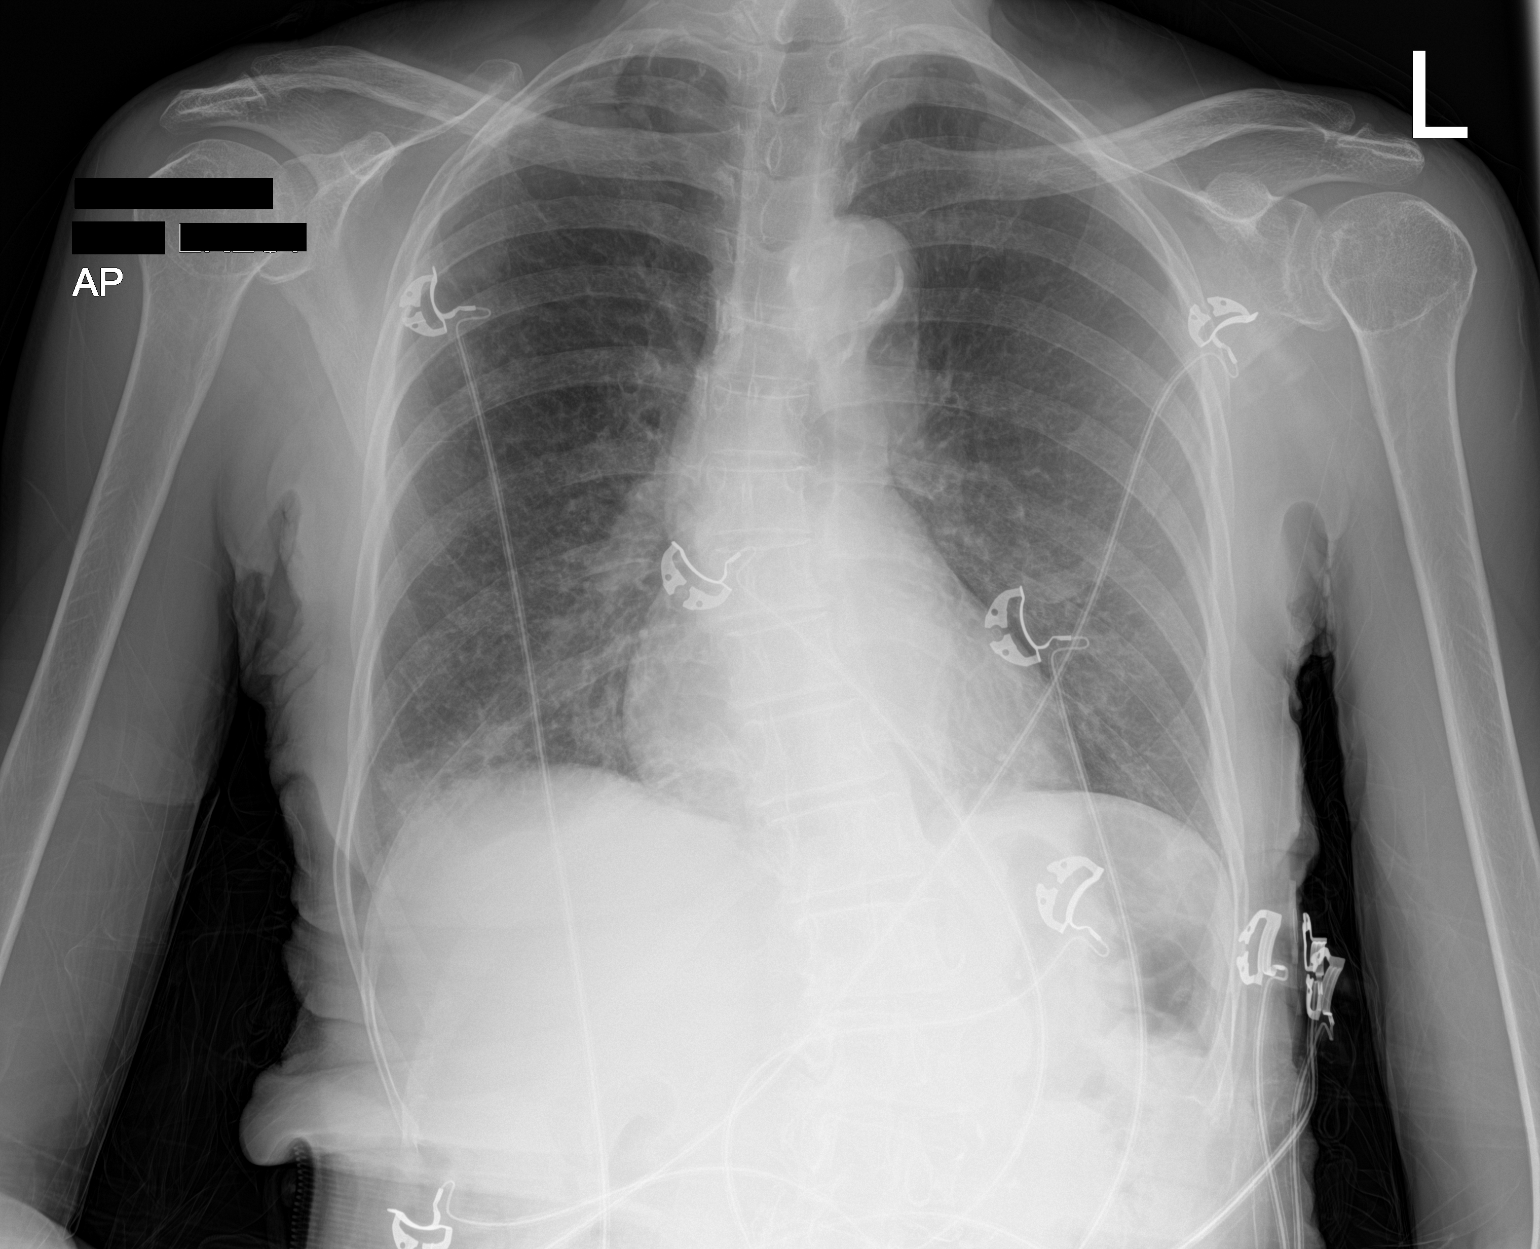

[1 of 1 positions shown; findings below may reference images not displayed]

FINDINGS: Normal heart size, mediastinal contours, and pulmonary vascularity.

Atherosclerotic calcification aorta.

RIGHT lower lobe infiltrate consistent with pneumonia.

Mild peribronchial thickening.

Remaining lungs clear.

No pleural effusion or pneumothorax.

Biconvex thoracolumbar scoliosis.
IMPRESSION: Bronchitic changes with RIGHT basilar infiltrate consistent with
pneumonia.

## 2024-05-21 ENCOUNTER — Other Ambulatory Visit: Payer: Self-pay

## 2024-05-21 ENCOUNTER — Encounter (HOSPITAL_BASED_OUTPATIENT_CLINIC_OR_DEPARTMENT_OTHER): Payer: Self-pay

## 2024-05-21 ENCOUNTER — Emergency Department (HOSPITAL_BASED_OUTPATIENT_CLINIC_OR_DEPARTMENT_OTHER)

## 2024-05-21 ENCOUNTER — Emergency Department (HOSPITAL_BASED_OUTPATIENT_CLINIC_OR_DEPARTMENT_OTHER)
Admission: EM | Admit: 2024-05-21 | Discharge: 2024-05-21 | Disposition: A | Discharge status: Home / Self Care | Attending: Emergency Medicine | Admitting: Emergency Medicine

## 2024-05-21 DIAGNOSIS — R059 Cough, unspecified: Secondary | ICD-10-CM | POA: Insufficient documentation

## 2024-05-21 DIAGNOSIS — R079 Chest pain, unspecified: Secondary | ICD-10-CM

## 2024-05-21 DIAGNOSIS — J069 Acute upper respiratory infection, unspecified: Secondary | ICD-10-CM | POA: Diagnosis not present

## 2024-05-21 DIAGNOSIS — R0602 Shortness of breath: Secondary | ICD-10-CM | POA: Diagnosis not present

## 2024-05-21 DIAGNOSIS — R0789 Other chest pain: Secondary | ICD-10-CM | POA: Insufficient documentation

## 2024-05-21 LAB — BASIC METABOLIC PANEL WITH GFR
Anion gap: 12 (ref 5–15)
BUN: 16 mg/dL (ref 8–23)
CO2: 23 mmol/L (ref 22–32)
Calcium: 9.3 mg/dL (ref 8.9–10.3)
Chloride: 104 mmol/L (ref 98–111)
Creatinine, Ser: 0.79 mg/dL (ref 0.44–1.00)
GFR, Estimated: >60 mL/min (ref >60)
Glucose, Bld: 85 mg/dL (ref 70–99)
Potassium: 4.5 mmol/L (ref 3.5–5.1)
Sodium: 139 mmol/L (ref 135–145)

## 2024-05-21 LAB — CBC
HCT: 39.8 % (ref 36.0–46.0)
Hemoglobin: 13.1 g/dL (ref 12.0–15.0)
MCH: 31.2 pg (ref 26.0–34.0)
MCHC: 32.9 g/dL (ref 30.0–36.0)
MCV: 94.8 fL (ref 80.0–100.0)
Platelets: 230 K/uL (ref 150–400)
RBC: 4.2 MIL/uL (ref 3.87–5.11)
RDW: 14 % (ref 11.5–15.5)
WBC: 4.1 K/uL (ref 4.0–10.5)
nRBC: 0 % (ref 0.0–0.2)

## 2024-05-21 LAB — RESP PANEL BY RT-PCR (RSV, FLU A&B, COVID)  RVPGX2
Influenza A by PCR: NEGATIVE
Influenza B by PCR: NEGATIVE
Resp Syncytial Virus by PCR: NEGATIVE
SARS Coronavirus 2 by RT PCR: NEGATIVE

## 2024-05-21 LAB — TROPONIN T, HIGH SENSITIVITY: Troponin T High Sensitivity: <15 ng/L (ref 0–19)

## 2024-05-21 MED ORDER — GUAIFENESIN-DM 100-10 MG/5ML PO SYRP: 5.0000 mL | ORAL_SOLUTION | Freq: Three times a day (TID) | ORAL | 0 refills | Status: AC | PRN

## 2024-05-21 NOTE — ED Provider Notes (Signed)
 [name] Provider Note   CSN: [id number] Arrival date & time: 05/21/24  1231     Patient presents with: Chest Pain   Mary Bradshaw is a 73 y.o. female.    Chest Pain Patient on for chest pain.  Anterior chest.  Comes and goes.  No real radiation.  Cramp in the legs at times.  Shortness of breath.  Also has a cough but no real production.  Patient does not have exertional pain.  Claims compliant with her blood pressure medicine although reviewing cardiology notes.  She has had issues with this in the past.     Prior to Admission medications   Medication Sig Start Date End Date Taking? Authorizing Provider  carvedilol (COREG) 6.25 MG tablet Take 6.25 mg by mouth 2 (two) times daily with a meal. 03/27/24 03/27/25 Yes [provider]  guaiFENesin -dextromethorphan (ROBITUSSIN DM) 100-10 MG/5ML syrup Take 5 mLs by mouth 3 (three) times daily as needed for cough. 05/21/24  Yes Patsey Lot, MD  hydrochlorothiazide  (HYDRODIURIL ) 25 MG tablet Take 25 mg by mouth daily. 05/09/24 05/09/25 Yes [provider]  albuterol  (PROVENTIL  HFA;VENTOLIN  HFA) 108 (90 Base) MCG/ACT inhaler Inhale 1-2 puffs into the lungs every 6 (six) hours as needed for wheezing. 07/13/17   Lynwood Anes, MD  amLODipine  (NORVASC ) 10 MG tablet Take 1 tablet (10 mg total) by mouth daily. 02/05/22   Caro Dennis HERO, NP  atorvastatin  (LIPITOR) 40 MG tablet Take 1 tablet (40 mg total) by mouth daily. 08/08/22 11/06/22  Briana Elgin LABOR, MD  doxycycline  (VIBRAMYCIN ) 100 MG capsule Take 1 capsule (100 mg total) by mouth 2 (two) times daily. 09/20/22   Roselyn Carlin NOVAK, MD  fluticasone  (FLONASE ) 50 MCG/ACT nasal spray Place 1 spray into both nostrils daily. Patient not taking: Reported on 08/03/2022 02/05/22   Caro Dennis HERO, NP  lactose free nutrition (BOOST PLUS) LIQD Take 237 mLs by mouth 3 (three) times daily with meals. 08/08/22   Briana Elgin LABOR, MD  nicotine   (NICODERM CQ  - DOSED IN MG/24 HOURS) 21 mg/24hr patch Place 1 patch (21 mg total) onto the skin daily. Patient not taking: Reported on 08/03/2022 02/05/22   Caro Dennis HERO, NP  predniSONE  (STERAPRED UNI-PAK 21 TAB) 10 MG (21) TBPK tablet 10mg  Tabs, 6 day taper. Use as directed 09/20/22   Roselyn Carlin NOVAK, MD    Allergies: Lisinopril     Review of Systems  Cardiovascular:  Positive for chest pain.    Updated Vital Signs BP (!) 161/92   Pulse 84   Temp 98.9 F (37.2 C) (Oral)   Resp (!) 26   LMP 10/25/1995   SpO2 100%   Physical Exam Vitals and nursing note reviewed.  Cardiovascular:     Rate and Rhythm: Normal rate.  Pulmonary:     Breath sounds: No decreased breath sounds or wheezing.     Comments: Somewhat harsh breath sounds without frank rales or rhonchi. Musculoskeletal:     Right lower leg: No edema.     Left lower leg: No edema.  Neurological:     Mental Status: She is alert.     (all labs ordered are listed, but only abnormal results are displayed) Labs Reviewed  RESP PANEL BY RT-PCR (RSV, FLU A&B, COVID)  [id number]  CBC  BASIC METABOLIC PANEL WITH GFR  TROPONIN T, HIGH SENSITIVITY  TROPONIN T, HIGH SENSITIVITY    EKG: None  Radiology: DG Chest 2 View Result Date: 05/21/2024 CLINICAL  DATA:  Chest pain.  Cough. EXAM: CHEST - 2 VIEW COMPARISON:  09/20/2022. FINDINGS: The heart size and mediastinal contours are within normal limits. Aortic atherosclerosis. Hyperinflation. Chronic bronchitic changes. No focal consolidation, pleural effusion, or pneumothorax. Redemonstrated scoliosis. No acute osseous abnormality. IMPRESSION: 1. No acute cardiopulmonary findings. 2. Hyperinflation with chronic bronchitic changes. Electronically Signed   By: Harrietta Sherry M.D.   On: 05/21/2024 13:33     Procedures   Medications Ordered in the ED - No data to display                                  Medical Decision Making Amount and/or Complexity of Data Reviewed Labs:  ordered. Radiology: ordered.   Patient with URI symptoms and cough.  Chest x-ray reassuring.  EKG reassuring and negative troponin.  Doubt cardiac ischemia.  Differential diagnose include pneumonia and cardiac cause.  Also other causes such as pneumothorax.  However I think stable for discharge home.  No pneumonia.  Do not think we need antibiotics.  Will treat symptomatically.  Discharge home.     Final diagnoses:  Nonspecific chest pain  Upper respiratory tract infection, unspecified type    ED Discharge Orders          Ordered    guaiFENesin -dextromethorphan (ROBITUSSIN DM) 100-10 MG/5ML syrup  3 times daily PRN        05/21/24 1437               Patsey Lot, MD 05/21/24 1450

## 2024-05-21 NOTE — ED Triage Notes (Signed)
 Reports central chest pain X 1 week. No radiation. Report intermittent SOB. Also reports cramps in bilateral legs

## 2024-05-27 ENCOUNTER — Other Ambulatory Visit: Payer: Self-pay

## 2024-05-27 ENCOUNTER — Emergency Department (HOSPITAL_BASED_OUTPATIENT_CLINIC_OR_DEPARTMENT_OTHER)
Admission: EM | Admit: 2024-05-27 | Discharge: 2024-05-27 | Disposition: A | Discharge status: Home / Self Care | Attending: Emergency Medicine | Admitting: Emergency Medicine

## 2024-05-27 ENCOUNTER — Emergency Department (HOSPITAL_BASED_OUTPATIENT_CLINIC_OR_DEPARTMENT_OTHER)

## 2024-05-27 ENCOUNTER — Encounter (HOSPITAL_BASED_OUTPATIENT_CLINIC_OR_DEPARTMENT_OTHER): Payer: Self-pay | Admitting: Emergency Medicine

## 2024-05-27 DIAGNOSIS — I10 Essential (primary) hypertension: Secondary | ICD-10-CM | POA: Diagnosis not present

## 2024-05-27 DIAGNOSIS — R5383 Other fatigue: Secondary | ICD-10-CM | POA: Diagnosis not present

## 2024-05-27 DIAGNOSIS — R42 Dizziness and giddiness: Secondary | ICD-10-CM | POA: Insufficient documentation

## 2024-05-27 DIAGNOSIS — I700 Atherosclerosis of aorta: Secondary | ICD-10-CM | POA: Insufficient documentation

## 2024-05-27 DIAGNOSIS — R0789 Other chest pain: Secondary | ICD-10-CM | POA: Diagnosis not present

## 2024-05-27 DIAGNOSIS — R519 Headache, unspecified: Secondary | ICD-10-CM | POA: Diagnosis not present

## 2024-05-27 DIAGNOSIS — Z79899 Other long term (current) drug therapy: Secondary | ICD-10-CM | POA: Diagnosis not present

## 2024-05-27 DIAGNOSIS — B349 Viral infection, unspecified: Secondary | ICD-10-CM | POA: Diagnosis not present

## 2024-05-27 DIAGNOSIS — R059 Cough, unspecified: Secondary | ICD-10-CM | POA: Diagnosis present

## 2024-05-27 LAB — BASIC METABOLIC PANEL WITH GFR
Anion gap: 13 (ref 5–15)
BUN: 17 mg/dL (ref 8–23)
CO2: 24 mmol/L (ref 22–32)
Calcium: 9.7 mg/dL (ref 8.9–10.3)
Chloride: 99 mmol/L (ref 98–111)
Creatinine, Ser: 0.91 mg/dL (ref 0.44–1.00)
GFR, Estimated: >60 mL/min (ref >60)
Glucose, Bld: 106 mg/dL — ABNORMAL HIGH (ref 70–99)
Potassium: 4 mmol/L (ref 3.5–5.1)
Sodium: 137 mmol/L (ref 135–145)

## 2024-05-27 LAB — HEPATIC FUNCTION PANEL
ALT: 7 U/L (ref 0–44)
AST: 25 U/L (ref 15–41)
Albumin: 4.4 g/dL (ref 3.5–5.0)
Alkaline Phosphatase: 86 U/L (ref 38–126)
Bilirubin, Direct: <0.1 mg/dL (ref 0.0–0.2)
Total Bilirubin: 0.2 mg/dL (ref 0.0–1.2)
Total Protein: 7.3 g/dL (ref 6.5–8.1)

## 2024-05-27 LAB — CBC
HCT: 42.2 % (ref 36.0–46.0)
Hemoglobin: 14 g/dL (ref 12.0–15.0)
MCH: 31.2 pg (ref 26.0–34.0)
MCHC: 33.2 g/dL (ref 30.0–36.0)
MCV: 94 fL (ref 80.0–100.0)
Platelets: 249 K/uL (ref 150–400)
RBC: 4.49 MIL/uL (ref 3.87–5.11)
RDW: 13.5 % (ref 11.5–15.5)
WBC: 5.3 K/uL (ref 4.0–10.5)
nRBC: 0 % (ref 0.0–0.2)

## 2024-05-27 LAB — TROPONIN T, HIGH SENSITIVITY: Troponin T High Sensitivity: <15 ng/L (ref 0–19)

## 2024-05-27 LAB — PRO BRAIN NATRIURETIC PEPTIDE: Pro Brain Natriuretic Peptide: 92.5 pg/mL (ref <300.0)

## 2024-05-27 MED ORDER — METOCLOPRAMIDE HCL 10 MG PO TABS
10.0000 mg | ORAL_TABLET | Freq: Once | ORAL | Status: DC
  Filled 2024-05-27: qty 1

## 2024-05-27 MED ORDER — ACETAMINOPHEN 500 MG PO TABS
1000.0000 mg | ORAL_TABLET | Freq: Once | ORAL | Status: AC
  Administered 2024-05-27: 1000 mg via ORAL
  Filled 2024-05-27: qty 2

## 2024-05-27 NOTE — ED Provider Notes (Signed)
 [place] Provider Note   CSN: [id number] Arrival date & time: 05/27/24  1202     Patient presents with: Chest Pain and Dizziness   Mary Bradshaw is a 73 y.o. female.    Chest Pain Associated symptoms: dizziness   Dizziness Associated symptoms: chest pain    Patient is a 73 year old female with past medical history significant for hypertension, kidney stones, headaches  She presents emergency room today with complaints of fatigue.  It seems that in triage she indicated that she had chest pain dizziness and a cough but she tells me that she had chest pain approximately 1 week ago and seen in the emergency department for this she states no new episodes of chest pain since then.  She does not feel overly short of breath she feels achy all over and fatigued.  She denies any vomiting, does endorse some mild nausea which is not present currently.  No diarrhea no abdominal pain no other associated symptoms.     Prior to Admission medications   Medication Sig Start Date End Date Taking? Authorizing Provider  albuterol  (PROVENTIL  HFA;VENTOLIN  HFA) 108 (90 Base) MCG/ACT inhaler Inhale 1-2 puffs into the lungs every 6 (six) hours as needed for wheezing. 07/13/17   Lynwood Anes, MD  amLODipine  (NORVASC ) 10 MG tablet Take 1 tablet (10 mg total) by mouth daily. 02/05/22   Caro Dennis HERO, NP  atorvastatin  (LIPITOR) 40 MG tablet Take 1 tablet (40 mg total) by mouth daily. 08/08/22 11/06/22  Briana Elgin LABOR, MD  carvedilol (COREG) 6.25 MG tablet Take 6.25 mg by mouth 2 (two) times daily with a meal. 03/27/24 03/27/25  [provider]  doxycycline  (VIBRAMYCIN ) 100 MG capsule Take 1 capsule (100 mg total) by mouth 2 (two) times daily. 09/20/22   Roselyn Carlin NOVAK, MD  fluticasone  (FLONASE ) 50 MCG/ACT nasal spray Place 1 spray into both nostrils daily. Patient not taking: Reported on 08/03/2022 02/05/22   Caro Dennis HERO, NP   guaiFENesin -dextromethorphan (ROBITUSSIN DM) 100-10 MG/5ML syrup Take 5 mLs by mouth 3 (three) times daily as needed for cough. 05/21/24   Patsey Lot, MD  hydrochlorothiazide  (HYDRODIURIL ) 25 MG tablet Take 25 mg by mouth daily. 05/09/24 05/09/25  [provider]  lactose free nutrition (BOOST PLUS) LIQD Take 237 mLs by mouth 3 (three) times daily with meals. 08/08/22   Briana Elgin LABOR, MD  nicotine  (NICODERM CQ  - DOSED IN MG/24 HOURS) 21 mg/24hr patch Place 1 patch (21 mg total) onto the skin daily. Patient not taking: Reported on 08/03/2022 02/05/22   Caro Dennis HERO, NP  predniSONE  (STERAPRED UNI-PAK 21 TAB) 10 MG (21) TBPK tablet 10mg  Tabs, 6 day taper. Use as directed 09/20/22   Roselyn Carlin NOVAK, MD    Allergies: Lisinopril     Review of Systems  Cardiovascular:  Positive for chest pain.  Neurological:  Positive for dizziness.    Updated Vital Signs BP (!) 159/94   Pulse 80   Temp 98.7 F (37.1 C) (Oral)   Resp 18   Ht 5' (1.524 m)   Wt 42.6 kg   LMP 10/25/1995   SpO2 96%   BMI 18.36 kg/m   Physical Exam Vitals and nursing note reviewed.  Constitutional:      General: She is not in acute distress. HENT:     Head: Normocephalic and atraumatic.     Nose: Nose normal.     Mouth/Throat:     Mouth: Mucous membranes are moist.  Eyes:     General: No scleral icterus. Cardiovascular:     Rate and Rhythm: Normal rate and regular rhythm.     Pulses: Normal pulses.     Heart sounds: Normal heart sounds.  Pulmonary:     Effort: Pulmonary effort is normal. No respiratory distress.     Breath sounds: No wheezing.  Abdominal:     Palpations: Abdomen is soft.     Tenderness: There is no abdominal tenderness.  Musculoskeletal:     Cervical back: Normal range of motion.     Right lower leg: No edema.     Left lower leg: No edema.  Skin:    General: Skin is warm and dry.     Capillary Refill: Capillary refill takes less than 2 seconds.  Neurological:      Mental Status: She is alert. Mental status is at baseline.     Comments: Alert and oriented to self, place, time and event.   Speech is fluent, clear without dysarthria or dysphasia.   Strength 5/5 in upper/lower extremities   Sensation intact in upper/lower extremities   Normal gait.  CN I not tested  CN II grossly intact visual fields bilaterally. Did not visualize posterior eye.  CN III, IV, VI PERRLA and EOMs intact bilaterally  CN V Intact sensation to sharp and light touch to the face  CN VII facial movements symmetric  CN VIII not tested  CN IX, X no uvula deviation, symmetric rise of soft palate  CN XI 5/5 SCM and trapezius strength bilaterally  CN XII Midline tongue protrusion, symmetric L/R movements   Psychiatric:        Mood and Affect: Mood normal.        Behavior: Behavior normal.     (all labs ordered are listed, but only abnormal results are displayed) Labs Reviewed  BASIC METABOLIC PANEL WITH GFR - Abnormal; Notable for the following components:      Result Value   Glucose, Bld 106 (*)    All other components within normal limits  CBC  PRO BRAIN NATRIURETIC PEPTIDE  HEPATIC FUNCTION PANEL  TROPONIN T, HIGH SENSITIVITY    EKG: None  Radiology: DG Chest 2 View Result Date: 05/27/2024 EXAM: 2 VIEW(S) XRAY OF THE CHEST 05/27/2024 12:29:29 PM COMPARISON: 05/21/2024 CLINICAL HISTORY: cough FINDINGS: LUNGS AND PLEURA: No focal pulmonary opacity. No pleural effusion. No pneumothorax. HEART AND MEDIASTINUM: Aortic atherosclerosis. No acute abnormality of the cardiac and mediastinal silhouettes. BONES AND SOFT TISSUES: Stable thoracolumbar scoliosis. No acute osseous abnormality. IMPRESSION: 1. No acute cardiopulmonary process. Electronically signed by: Donnice Mania MD 05/27/2024 01:05 PM EST RP Workstation: [id number]     Procedures   Medications Ordered in the ED  acetaminophen  (TYLENOL ) tablet 1,000 mg (1,000 mg Oral Given 05/27/24 1522)                                     Medical Decision Making Amount and/or Complexity of Data Reviewed Labs: ordered. Radiology: ordered.  Risk OTC drugs. Prescription drug management.   Patient is a 73 year old female with past medical history significant for hypertension, kidney stones, headaches  She presents emergency room today with complaints of fatigue.  It seems that in triage she indicated that she had chest pain dizziness and a cough but she tells me that she had chest pain approximately 1 week ago and seen in the emergency department for this she  states no new episodes of chest pain since then.  She does not feel overly short of breath she feels achy all over and fatigued.  She denies any vomiting, does endorse some mild nausea which is not present currently.  No diarrhea no abdominal pain no other associated symptoms.  The differential diagnosis of weakness includes but is not limited to neurologic causes (GBS, myasthenia gravis, CVA, MS, ALS, transverse myelitis, spinal cord injury, CVA, botulism, ) and other causes: ACS, Arrhythmia, syncope, orthostatic hypotension, sepsis, hypoglycemia, electrolyte disturbance, hypothyroidism, respiratory failure, symptomatic anemia, dehydration, heat injury, polypharmacy, malignancy.  Physical exam unremarkable patient is well-appearing ambulated without difficulty or desaturation.  BMP CBC LFTs BNP and troponin all reassuring.  Chest x-ray unremarkable EKG nonischemic.  Return precautions emergency room provided.  I offered additional workup including second troponin, CT chest to evaluate for PE however I have low suspicion for this and patient is requesting to be discharged home she is mainly here to request recommendations for cough.  I recommended warm tea and honey, gargling warm salt water and making sure she hydrates.  She feels better after 1 dose of Tylenol  here in the emergency department will discharge home.   Final diagnoses:  Viral illness   Atypical chest pain    ED Discharge Orders     None          Neldon Hamp RAMAN, [place] 05/27/24 2334    Dreama Longs, MD 05/28/24 [phone]

## 2024-05-27 NOTE — ED Triage Notes (Signed)
 Pt c/o chest pain and dizziness, seen last week for same.  + productive cough,   Denies fever, n/v/d. When asked to rate pain, pt states she does not have pain.  When asked regarding the chest pain she c/o; she said it comes and go.

## 2024-05-27 NOTE — ED Notes (Signed)
 Patient ambulated with pulse ox. SAT 97%, HR 88. No distress noted

## 2024-05-27 NOTE — Discharge Instructions (Signed)
 I suspect that your symptoms are related to a viral illness of like you to use fluticasone  2 sprays in each nostril twice daily, make sure you are drinking plenty of water and follow-up with your primary care doctor.  Return to the emergency room for any new or concerning symptoms.  Your lab work today is quite reassuring.
